# Patient Record
Sex: Male | Born: 1935 | Race: White | Hispanic: No | Smoking: Never smoker
Health system: Southern US, Community
[De-identification: ages and names within clinical notes are randomized; demographics above are authoritative.]

## PROBLEM LIST (undated history)

## (undated) DIAGNOSIS — Z950 Presence of cardiac pacemaker: Secondary | ICD-10-CM

## (undated) DIAGNOSIS — I10 Essential (primary) hypertension: Secondary | ICD-10-CM

## (undated) DIAGNOSIS — Z7901 Long term (current) use of anticoagulants: Secondary | ICD-10-CM

## (undated) DIAGNOSIS — N183 Chronic kidney disease, stage 3 unspecified: Secondary | ICD-10-CM

## (undated) DIAGNOSIS — K219 Gastro-esophageal reflux disease without esophagitis: Secondary | ICD-10-CM

## (undated) DIAGNOSIS — K5790 Diverticulosis of intestine, part unspecified, without perforation or abscess without bleeding: Secondary | ICD-10-CM

## (undated) DIAGNOSIS — Z972 Presence of dental prosthetic device (complete) (partial): Secondary | ICD-10-CM

## (undated) DIAGNOSIS — E119 Type 2 diabetes mellitus without complications: Secondary | ICD-10-CM

## (undated) DIAGNOSIS — G5793 Unspecified mononeuropathy of bilateral lower limbs: Secondary | ICD-10-CM

## (undated) DIAGNOSIS — K589 Irritable bowel syndrome without diarrhea: Secondary | ICD-10-CM

## (undated) DIAGNOSIS — N189 Chronic kidney disease, unspecified: Secondary | ICD-10-CM

## (undated) DIAGNOSIS — I2510 Atherosclerotic heart disease of native coronary artery without angina pectoris: Secondary | ICD-10-CM

## (undated) DIAGNOSIS — Z87442 Personal history of urinary calculi: Secondary | ICD-10-CM

## (undated) DIAGNOSIS — D696 Thrombocytopenia, unspecified: Secondary | ICD-10-CM

## (undated) DIAGNOSIS — H9190 Unspecified hearing loss, unspecified ear: Secondary | ICD-10-CM

## (undated) DIAGNOSIS — E538 Deficiency of other specified B group vitamins: Secondary | ICD-10-CM

## (undated) DIAGNOSIS — F419 Anxiety disorder, unspecified: Secondary | ICD-10-CM

## (undated) DIAGNOSIS — M109 Gout, unspecified: Secondary | ICD-10-CM

## (undated) DIAGNOSIS — I420 Dilated cardiomyopathy: Secondary | ICD-10-CM

## (undated) DIAGNOSIS — Z8679 Personal history of other diseases of the circulatory system: Secondary | ICD-10-CM

## (undated) DIAGNOSIS — G459 Transient cerebral ischemic attack, unspecified: Secondary | ICD-10-CM

## (undated) DIAGNOSIS — M1990 Unspecified osteoarthritis, unspecified site: Secondary | ICD-10-CM

## (undated) DIAGNOSIS — I499 Cardiac arrhythmia, unspecified: Secondary | ICD-10-CM

## (undated) DIAGNOSIS — Z8719 Personal history of other diseases of the digestive system: Secondary | ICD-10-CM

## (undated) HISTORY — PX: TONSILLECTOMY: SUR1361

## (undated) HISTORY — PX: COLONOSCOPY: SHX174

## (undated) HISTORY — PX: EYE SURGERY: SHX253

## (undated) HISTORY — PX: NECK SURGERY: SHX720

## (undated) HISTORY — PX: SHOULDER SURGERY: SHX246

## (undated) HISTORY — PX: INSERT / REPLACE / REMOVE PACEMAKER: SUR710

---

## 2004-05-22 ENCOUNTER — Emergency Department: Payer: Self-pay | Admitting: General Practice

## 2004-05-31 ENCOUNTER — Ambulatory Visit: Payer: Self-pay | Admitting: Urology

## 2004-06-07 ENCOUNTER — Ambulatory Visit: Payer: Self-pay | Admitting: Urology

## 2004-06-09 ENCOUNTER — Ambulatory Visit: Payer: Self-pay | Admitting: Urology

## 2005-04-13 ENCOUNTER — Ambulatory Visit: Payer: Self-pay | Admitting: Unknown Physician Specialty

## 2005-06-27 ENCOUNTER — Ambulatory Visit: Payer: Self-pay | Admitting: Unknown Physician Specialty

## 2005-11-20 ENCOUNTER — Ambulatory Visit: Payer: Self-pay | Admitting: Urology

## 2006-03-20 ENCOUNTER — Ambulatory Visit: Payer: Self-pay | Admitting: Unknown Physician Specialty

## 2006-11-12 HISTORY — PX: CORONARY ANGIOPLASTY: SHX604

## 2006-11-26 ENCOUNTER — Ambulatory Visit: Payer: Self-pay

## 2006-12-18 ENCOUNTER — Other Ambulatory Visit: Payer: Self-pay

## 2006-12-18 ENCOUNTER — Ambulatory Visit: Payer: Self-pay | Admitting: *Deleted

## 2007-07-31 ENCOUNTER — Ambulatory Visit (HOSPITAL_COMMUNITY): Admission: RE | Admit: 2007-07-31 | Discharge: 2007-07-31 | Payer: Self-pay | Admitting: Cardiology

## 2008-01-22 ENCOUNTER — Ambulatory Visit: Payer: Self-pay | Admitting: Unknown Physician Specialty

## 2008-04-01 ENCOUNTER — Ambulatory Visit: Payer: Self-pay | Admitting: Unknown Physician Specialty

## 2008-05-11 ENCOUNTER — Ambulatory Visit: Payer: Self-pay | Admitting: Unknown Physician Specialty

## 2008-12-24 ENCOUNTER — Ambulatory Visit: Payer: Self-pay | Admitting: Surgery

## 2008-12-28 ENCOUNTER — Ambulatory Visit: Payer: Self-pay | Admitting: Surgery

## 2010-07-01 ENCOUNTER — Other Ambulatory Visit: Payer: Self-pay | Admitting: Orthopedic Surgery

## 2010-07-01 DIAGNOSIS — M542 Cervicalgia: Secondary | ICD-10-CM

## 2010-07-04 ENCOUNTER — Ambulatory Visit
Admission: RE | Admit: 2010-07-04 | Discharge: 2010-07-04 | Disposition: A | Discharge status: Home / Self Care | Payer: Medicare Other | Source: Ambulatory Visit | Attending: Orthopedic Surgery | Admitting: Orthopedic Surgery

## 2010-07-04 DIAGNOSIS — M542 Cervicalgia: Secondary | ICD-10-CM

## 2010-07-26 NOTE — Op Note (Signed)
Juan Webster, Juan Webster                   ACCOUNT NO.:  [id number]   MEDICAL RECORD NO.:  [id number]          PATIENT TYPE:   LOCATION:                                 FACILITY:   PHYSICIAN:  Ritta Slot, MD     DATE OF BIRTH:  1935/07/27   DATE OF PROCEDURE:  07/31/2007  DATE OF DISCHARGE:                               OPERATIVE REPORT   PROCEDURE PERFORMED:  Dual-chamber pacemaker change due to elective  replacement indicator.   INDICATIONS AND PATIENT PROFILE:  Mr. Skelley is a very pleasant 75-year-  old male with history of CAD having received a drug-eluting stent to his  RCA on November 18, 2006, and also has a permanent pacemaker due to  history of underlying complete heart block, status post dual-chamber  pacer implant in August 2002.  He presented to [facility] on Jul 26, 2007, with symptoms of fatigue and lightheadedness, and he was found  to have his pacemaker generate ERI with VVI at 65 beats per minute.  Therefore, it was felt that his symptoms were due to coming to his  pacemaker being in ERI with VVI mode only.  There was no rate response  to him.  He was therefore brought to the [facility] for a change of his dual-chamber pacemaker,  so that he could have a new generator inserted.   PROCEDURE PERFORMED:  After informed consent, the patient's right  subclavicular region was prepped and draped in the usual sterile  fashion.  ECG monitoring was established.  A 2 mg of Versed and 75 mcg  of fentanyl were given intravenously for light sedation.  A 30 nL of  lidocaine was infiltrated over the prior pacemaker site and pocket for  local anesthesia.  Next, approximately 3-cm mid subclavicular incision  was then carried out, and hemostasis was obtained with electrocautery.  Blunt dissection was used to carry this down to the left pectoral  fascia.  Hemostasis was again obtained with electrocautery.  The old  generator and leads were  then identified and removed.  The old generator  was a Kappa 700DL, model number [id number], serial number [id number].  The A  lead with model number [id number], serial number [id number] was then  unscrewed from the Kappa generator.  It was then interrogated.  Once  thresholds and impedances were obtained, the V lead, model number  [id number], serial number [id number] was then removed from the generator and  hooked up to the analyzer.  Immediate capturing was obtained as he did  not have any underlying rhythm.  RA lead showed an R wave of 4.8 mV,  impedance was 558 ohms, threshold was 0.5 milliseconds, and current was  1.2 mA.  The RV lead interrogation revealed an impedance of 506 ohms,  threshold of 0.5 V at 0.5 milliseconds, and current was 1.1 mA.  No R  waves were sent.  The pocket was then irrigated with 1% kanamycin  solution.  Hemostasis obtained once again with cautery.  The A and the V  lead were then screwed to a  new adapted generator, model number ADDRL1,  serial number [id number], and pacing confirmed immediately.  The  pacemaker and leads were then delivered to the old pocket.  Hemostasis  once again obtained with electrocautery.  The generator was then sutured  down to the pectoralis fascia with silk suture after these were  confirmed to be tightened to the pacer pocket by screwing them in.  Subcutaneous layer was then closed using 2-0 Vicryl.  The thin skin was  then closed with 4-0 Vicryl.  Steri-Strips were applied.  The patient  returned to the recovery room in stable condition.   CONCLUSION:  Successful explant of a Kappa [id number], serial number  [id number], and successful implant of Medtronic adaptor LADDRL1, serial  number [id number].   The patient is sent home on 875 mg of Augmentin b.i.d. for 7 days.  He  will follow up in the [facility] next week.      Ritta Slot, MD  Electronically Signed     HS/MEDQ  D:  07/31/2007  T:  08/01/2007  Job:  [id number]

## 2010-10-25 ENCOUNTER — Ambulatory Visit: Payer: Self-pay | Admitting: Unknown Physician Specialty

## 2010-10-25 DIAGNOSIS — I10 Essential (primary) hypertension: Secondary | ICD-10-CM

## 2010-11-01 ENCOUNTER — Ambulatory Visit: Payer: Self-pay | Admitting: Unknown Physician Specialty

## 2012-08-27 ENCOUNTER — Ambulatory Visit: Payer: Self-pay | Admitting: Physical Medicine and Rehabilitation

## 2013-03-13 HISTORY — PX: NECK SURGERY: SHX720

## 2013-07-25 ENCOUNTER — Ambulatory Visit: Payer: Self-pay | Admitting: Internal Medicine

## 2015-06-24 ENCOUNTER — Encounter: Payer: Self-pay | Admitting: *Deleted

## 2015-06-29 NOTE — Discharge Instructions (Signed)

## 2015-06-30 ENCOUNTER — Ambulatory Visit
Admission: RE | Admit: 2015-06-30 | Discharge: 2015-06-30 | Disposition: A | Discharge status: Home / Self Care | Payer: Medicare HMO | Source: Ambulatory Visit | Attending: Ophthalmology | Admitting: Ophthalmology

## 2015-06-30 ENCOUNTER — Ambulatory Visit: Payer: Medicare HMO | Admitting: Anesthesiology

## 2015-06-30 ENCOUNTER — Encounter
Admission: RE | Disposition: A | Discharge status: Home / Self Care | Payer: Self-pay | Source: Ambulatory Visit | Attending: Ophthalmology

## 2015-06-30 DIAGNOSIS — Z9889 Other specified postprocedural states: Secondary | ICD-10-CM | POA: Diagnosis not present

## 2015-06-30 DIAGNOSIS — E1140 Type 2 diabetes mellitus with diabetic neuropathy, unspecified: Secondary | ICD-10-CM | POA: Diagnosis not present

## 2015-06-30 DIAGNOSIS — Z950 Presence of cardiac pacemaker: Secondary | ICD-10-CM | POA: Diagnosis not present

## 2015-06-30 DIAGNOSIS — K589 Irritable bowel syndrome without diarrhea: Secondary | ICD-10-CM | POA: Insufficient documentation

## 2015-06-30 DIAGNOSIS — E119 Type 2 diabetes mellitus without complications: Secondary | ICD-10-CM | POA: Diagnosis not present

## 2015-06-30 DIAGNOSIS — M109 Gout, unspecified: Secondary | ICD-10-CM | POA: Diagnosis not present

## 2015-06-30 DIAGNOSIS — H2512 Age-related nuclear cataract, left eye: Secondary | ICD-10-CM | POA: Diagnosis not present

## 2015-06-30 DIAGNOSIS — Z888 Allergy status to other drugs, medicaments and biological substances status: Secondary | ICD-10-CM | POA: Diagnosis not present

## 2015-06-30 DIAGNOSIS — H269 Unspecified cataract: Secondary | ICD-10-CM | POA: Diagnosis present

## 2015-06-30 DIAGNOSIS — M1990 Unspecified osteoarthritis, unspecified site: Secondary | ICD-10-CM | POA: Insufficient documentation

## 2015-06-30 HISTORY — DX: Personal history of other diseases of the digestive system: Z87.19

## 2015-06-30 HISTORY — PX: CATARACT EXTRACTION W/PHACO: SHX586

## 2015-06-30 HISTORY — DX: Unspecified osteoarthritis, unspecified site: M19.90

## 2015-06-30 HISTORY — DX: Essential (primary) hypertension: I10

## 2015-06-30 HISTORY — DX: Diverticulosis of intestine, part unspecified, without perforation or abscess without bleeding: K57.90

## 2015-06-30 HISTORY — DX: Gout, unspecified: M10.9

## 2015-06-30 HISTORY — DX: Irritable bowel syndrome without diarrhea: K58.9

## 2015-06-30 HISTORY — DX: Unspecified mononeuropathy of bilateral lower limbs: G57.93

## 2015-06-30 HISTORY — DX: Chronic kidney disease, unspecified: N18.9

## 2015-06-30 HISTORY — DX: Unspecified hearing loss, unspecified ear: H91.90

## 2015-06-30 HISTORY — DX: Presence of cardiac pacemaker: Z95.0

## 2015-06-30 HISTORY — DX: Cardiac arrhythmia, unspecified: I49.9

## 2015-06-30 HISTORY — DX: Type 2 diabetes mellitus without complications: E11.9

## 2015-06-30 HISTORY — DX: Presence of dental prosthetic device (complete) (partial): Z97.2

## 2015-06-30 LAB — GLUCOSE, CAPILLARY
GLUCOSE-CAPILLARY: 88 mg/dL (ref 65–99)
Glucose-Capillary: 94 mg/dL (ref 65–99)

## 2015-06-30 SURGERY — PHACOEMULSIFICATION, CATARACT, WITH IOL INSERTION
Anesthesia: Monitor Anesthesia Care | Laterality: Left | Wound class: Clean

## 2015-06-30 MED ORDER — MIDAZOLAM HCL 2 MG/2ML IJ SOLN
INTRAMUSCULAR | Status: DC | PRN
  Administered 2015-06-30: 2 mg via INTRAVENOUS

## 2015-06-30 MED ORDER — NA HYALUR & NA CHOND-NA HYALUR 0.4-0.35 ML IO KIT
PACK | INTRAOCULAR | Status: DC | PRN
  Administered 2015-06-30: 1 mL via INTRAOCULAR

## 2015-06-30 MED ORDER — CEFUROXIME OPHTHALMIC INJECTION 1 MG/0.1 ML
INJECTION | OPHTHALMIC | Status: DC | PRN
  Administered 2015-06-30: 0.1 mL via OPHTHALMIC

## 2015-06-30 MED ORDER — FENTANYL CITRATE (PF) 100 MCG/2ML IJ SOLN
INTRAMUSCULAR | Status: DC | PRN
  Administered 2015-06-30: 50 ug via INTRAVENOUS

## 2015-06-30 MED ORDER — TIMOLOL MALEATE 0.5 % OP SOLN
OPHTHALMIC | Status: DC | PRN
  Administered 2015-06-30: 1 [drp] via OPHTHALMIC

## 2015-06-30 MED ORDER — LIDOCAINE HCL (PF) 4 % IJ SOLN
INTRAOCULAR | Status: DC | PRN
  Administered 2015-06-30: 1 mL via OPHTHALMIC

## 2015-06-30 MED ORDER — BRIMONIDINE TARTRATE 0.2 % OP SOLN
OPHTHALMIC | Status: DC | PRN
  Administered 2015-06-30: 1 [drp] via OPHTHALMIC

## 2015-06-30 MED ORDER — TETRACAINE HCL 0.5 % OP SOLN
1.0000 [drp] | OPHTHALMIC | Status: DC | PRN
  Administered 2015-06-30: 1 [drp] via OPHTHALMIC

## 2015-06-30 MED ORDER — POVIDONE-IODINE 5 % OP SOLN
1.0000 "application " | OPHTHALMIC | Status: DC | PRN
  Administered 2015-06-30: 1 via OPHTHALMIC

## 2015-06-30 MED ORDER — EPINEPHRINE HCL 1 MG/ML IJ SOLN
INTRAMUSCULAR | Status: DC | PRN
  Administered 2015-06-30: 64 mL via OPHTHALMIC

## 2015-06-30 MED ORDER — ARMC OPHTHALMIC DILATING GEL
1.0000 "application " | OPHTHALMIC | Status: DC | PRN
  Administered 2015-06-30 (×2): 1 via OPHTHALMIC

## 2015-06-30 SURGICAL SUPPLY — 28 items
CANNULA ANT/CHMB 27GA (MISCELLANEOUS) ×3 IMPLANT
CARTRIDGE ABBOTT (MISCELLANEOUS) ×3 IMPLANT
GLOVE SURG LX 7.5 STRW (GLOVE) ×2
GLOVE SURG LX STRL 7.5 STRW (GLOVE) ×1 IMPLANT
GLOVE SURG TRIUMPH 8.0 PF LTX (GLOVE) ×3 IMPLANT
GOWN STRL REUS W/ TWL LRG LVL3 (GOWN DISPOSABLE) ×2 IMPLANT
GOWN STRL REUS W/TWL LRG LVL3 (GOWN DISPOSABLE) ×4
LENS IOL TECNIS TRC I 225 17.5 (Intraocular Lens) ×1 IMPLANT
LENS IOL TORIC 17.5 (Intraocular Lens) ×2 IMPLANT
LENS IOL TORIC 225 17.5 (Intraocular Lens) ×1 IMPLANT
MARKER SKIN DUAL TIP RULER LAB (MISCELLANEOUS) ×3 IMPLANT
NDL RETROBULBAR .5 NSTRL (NEEDLE) IMPLANT
NEEDLE FILTER BLUNT 18X 1/2SAF (NEEDLE) ×2
NEEDLE FILTER BLUNT 18X1 1/2 (NEEDLE) ×1 IMPLANT
PACK CATARACT BRASINGTON (MISCELLANEOUS) ×3 IMPLANT
PACK EYE AFTER SURG (MISCELLANEOUS) ×3 IMPLANT
PACK OPTHALMIC (MISCELLANEOUS) ×3 IMPLANT
RING MALYGIN 7.0 (MISCELLANEOUS) IMPLANT
SUT ETHILON 10-0 CS-B-6CS-B-6 (SUTURE)
SUT VICRYL  9 0 (SUTURE)
SUT VICRYL 9 0 (SUTURE) IMPLANT
SUTURE EHLN 10-0 CS-B-6CS-B-6 (SUTURE) IMPLANT
SYR 3ML LL SCALE MARK (SYRINGE) ×3 IMPLANT
SYR 5ML LL (SYRINGE) IMPLANT
SYR TB 1ML LUER SLIP (SYRINGE) ×3 IMPLANT
WATER STERILE IRR 250ML POUR (IV SOLUTION) ×3 IMPLANT
WATER STERILE IRR 500ML POUR (IV SOLUTION) IMPLANT
WIPE NON LINTING 3.25X3.25 (MISCELLANEOUS) ×3 IMPLANT

## 2015-06-30 NOTE — Anesthesia Preprocedure Evaluation (Addendum)
Anesthesia Evaluation  Patient identified by MRN, date of birth, ID band Patient awake    Reviewed: Allergy & Precautions, NPO status , Patient's Chart, lab work & pertinent test results, reviewed documented beta blocker date and time   Airway Mallampati: II  TM Distance: >3 FB Neck ROM: Full    Dental  (+) Partial Lower, Upper Dentures   Pulmonary neg pulmonary ROS,    Pulmonary exam normal        Cardiovascular hypertension, Pt. on medications Normal cardiovascular exam+ dysrhythmias + pacemaker      Neuro/Psych  Neuromuscular disease    GI/Hepatic Neg liver ROS, hiatal hernia, GERD  ,  Endo/Other  diabetes, Type 2, Oral Hypoglycemic Agents  Renal/GU Renal disease     Musculoskeletal  (+) Arthritis , Osteoarthritis,    Abdominal   Peds  Hematology negative hematology ROS (+)   Anesthesia Other Findings   Reproductive/Obstetrics                            Anesthesia Physical Anesthesia Plan  ASA: III  Anesthesia Plan: MAC   Post-op Pain Management:    Induction: Intravenous  Airway Management Planned:   Additional Equipment:   Intra-op Plan:   Post-operative Plan:   Informed Consent: I have reviewed the patients History and Physical, chart, labs and discussed the procedure including the risks, benefits and alternatives for the proposed anesthesia with the patient or authorized representative who has indicated his/her understanding and acceptance.     Plan Discussed with: CRNA  Anesthesia Plan Comments:         Anesthesia Quick Evaluation

## 2015-06-30 NOTE — Op Note (Signed)
LOCATION:  [facility]   PREOPERATIVE DIAGNOSIS:  Nuclear sclerotic cataract of the left eye.  H25.12  POSTOPERATIVE DIAGNOSIS:  Nuclear sclerotic cataract of the left eye.   PROCEDURE:  Phacoemulsification with Toric posterior chamber intraocular lens placement of the left eye.   LENS:  Implant Name Type Inv. Item Serial No. Manufacturer Lot No. LRB No. Used  toric 17.5D SE  lens  [id number]     [id number] [facility]   Left 1   Toric intraocular lens with 2.25 diopters of cylindrical power with axis orientation at 99 degrees.   ULTRASOUND TIME: 15 % of 1 minutes, 4 seconds.  CDE 9.5   SURGEON:  Deirdre Evenerhadwick R. Chrystal Zeimet, MD   ANESTHESIA:  Topical with tetracaine drops and 2% Xylocaine jelly, augmented with 1% preservative-free intracameral lidocaine.  COMPLICATIONS:  None.   DESCRIPTION OF PROCEDURE:  The patient was identified in the holding room and transported to the operating suite and placed in the supine position under the operating microscope.  The left eye was identified as the operative eye, and it was prepped and draped in the usual sterile ophthalmic fashion.    A clear-corneal paracentesis incision was made at the 1:30 position.  0.5 ml of preservative-free 1% lidocaine was injected into the anterior chamber. The anterior chamber was filled with Viscoat.  A 2.4 millimeter near clear corneal incision was then made at the 10:30 position.  A cystotome and capsulorrhexis forceps were then used to make a curvilinear capsulorrhexis.  Hydrodissection and hydrodelineation were then performed using balanced salt solution.   Phacoemulsification was then used in stop and chop fashion to remove the lens, nucleus and epinucleus.  The remaining cortex was aspirated using the irrigation and aspiration handpiece.  Provisc viscoelastic was then placed into the capsular bag to distend it for lens placement.  The Verion digital marker was used to align the implant at the intended axis.   A  17.5 diopter lens was then injected into the capsular bag.  It was rotated clockwise until the axis marks on the lens were approximately 15 degrees in the counterclockwise direction to the intended alignment.  The viscoelastic was aspirated from the eye using the irrigation aspiration handpiece.  Then, a Koch spatula through the sideport incision was used to rotate the lens in a clockwise direction until the axis markings of the intraocular lens were lined up with the Verion alignment.  Balanced salt solution was then used to hydrate the wounds. Cefuroxime 0.1 ml of a 10mg /ml solution was injected into the anterior chamber for a dose of 1 mg of intracameral antibiotic at the completion of the case.    The eye was noted to have a physiologic pressure and there was no wound leak noted.   Timolol and Brimonidine drops were applied to the eye.  The patient was taken to the recovery room in stable condition having had no complications of anesthesia or surgery.  Landry Kamath 06/30/2015, 8:35 AM

## 2015-06-30 NOTE — Anesthesia Procedure Notes (Signed)
Procedure Name: MAC Performed by: Deanglo Hissong Pre-anesthesia Checklist: Patient identified, Emergency Drugs available, Suction available, Timeout performed and Patient being monitored Patient Re-evaluated:Patient Re-evaluated prior to inductionOxygen Delivery Method: Nasal cannula Placement Confirmation: positive ETCO2       

## 2015-06-30 NOTE — Transfer of Care (Signed)
Immediate Anesthesia Transfer of Care Note  Patient: Juan Webster  Procedure(s) Performed: Procedure(s) with comments: CATARACT EXTRACTION PHACO AND INTRAOCULAR LENS PLACEMENT (IOC) (Left) - DIABETIC - oral meds  Patient Location: PACU  Anesthesia Type: MAC  Level of Consciousness: awake, alert  and patient cooperative  Airway and Oxygen Therapy: Patient Spontanous Breathing and Patient connected to supplemental oxygen  Post-op Assessment: Post-op Vital signs reviewed, Patient's Cardiovascular Status Stable, Respiratory Function Stable, Patent Airway and No signs of Nausea or vomiting  Post-op Vital Signs: Reviewed and stable  Complications: No apparent anesthesia complications

## 2015-06-30 NOTE — Anesthesia Postprocedure Evaluation (Signed)
Anesthesia Post Note  Patient: Juan Webster  Procedure(s) Performed: Procedure(s) (LRB): CATARACT EXTRACTION PHACO AND INTRAOCULAR LENS PLACEMENT (IOC) (Left)  Patient location during evaluation: PACU Anesthesia Type: MAC Level of consciousness: awake and alert and oriented Pain management: pain level controlled Vital Signs Assessment: post-procedure vital signs reviewed and stable Respiratory status: spontaneous breathing and nonlabored ventilation Cardiovascular status: stable Postop Assessment: no signs of nausea or vomiting and adequate PO intake Anesthetic complications: no    Harolyn RutherfordJoshua Steven Veazie

## 2015-06-30 NOTE — Addendum Note (Signed)
Addendum  created 06/30/15 0844 by Harolyn RutherfordJoshua Jamisyn Langer, DO   Modules edited: Anesthesia Review and Sign Navigator Section

## 2015-06-30 NOTE — H&P (Signed)
  The History and Physical notes are on paper, have been signed, and are to be scanned. The patient remains stable and unchanged from the H&P.   Previous H&P reviewed, patient examined, and there are no changes.  Chrystel Barefield 06/30/2015 7:43 AM

## 2015-07-01 ENCOUNTER — Encounter: Payer: Self-pay | Admitting: Ophthalmology

## 2015-07-22 NOTE — Discharge Instructions (Signed)

## 2015-07-23 ENCOUNTER — Encounter: Payer: Self-pay | Admitting: *Deleted

## 2015-07-28 ENCOUNTER — Ambulatory Visit
Admission: RE | Admit: 2015-07-28 | Discharge: 2015-07-28 | Disposition: A | Discharge status: Home / Self Care | Payer: Medicare HMO | Source: Ambulatory Visit | Attending: Ophthalmology | Admitting: Ophthalmology

## 2015-07-28 ENCOUNTER — Ambulatory Visit: Payer: Medicare HMO | Admitting: Anesthesiology

## 2015-07-28 ENCOUNTER — Encounter
Admission: RE | Disposition: A | Discharge status: Home / Self Care | Payer: Self-pay | Source: Ambulatory Visit | Attending: Ophthalmology

## 2015-07-28 DIAGNOSIS — G629 Polyneuropathy, unspecified: Secondary | ICD-10-CM | POA: Diagnosis not present

## 2015-07-28 DIAGNOSIS — I10 Essential (primary) hypertension: Secondary | ICD-10-CM | POA: Insufficient documentation

## 2015-07-28 DIAGNOSIS — E119 Type 2 diabetes mellitus without complications: Secondary | ICD-10-CM | POA: Insufficient documentation

## 2015-07-28 DIAGNOSIS — I499 Cardiac arrhythmia, unspecified: Secondary | ICD-10-CM | POA: Diagnosis not present

## 2015-07-28 DIAGNOSIS — H9190 Unspecified hearing loss, unspecified ear: Secondary | ICD-10-CM | POA: Insufficient documentation

## 2015-07-28 DIAGNOSIS — M109 Gout, unspecified: Secondary | ICD-10-CM | POA: Insufficient documentation

## 2015-07-28 DIAGNOSIS — Z950 Presence of cardiac pacemaker: Secondary | ICD-10-CM | POA: Diagnosis not present

## 2015-07-28 DIAGNOSIS — H2511 Age-related nuclear cataract, right eye: Secondary | ICD-10-CM | POA: Insufficient documentation

## 2015-07-28 DIAGNOSIS — K449 Diaphragmatic hernia without obstruction or gangrene: Secondary | ICD-10-CM | POA: Diagnosis not present

## 2015-07-28 DIAGNOSIS — Z888 Allergy status to other drugs, medicaments and biological substances status: Secondary | ICD-10-CM | POA: Insufficient documentation

## 2015-07-28 DIAGNOSIS — K5790 Diverticulosis of intestine, part unspecified, without perforation or abscess without bleeding: Secondary | ICD-10-CM | POA: Insufficient documentation

## 2015-07-28 DIAGNOSIS — M1990 Unspecified osteoarthritis, unspecified site: Secondary | ICD-10-CM | POA: Diagnosis not present

## 2015-07-28 DIAGNOSIS — K589 Irritable bowel syndrome without diarrhea: Secondary | ICD-10-CM | POA: Insufficient documentation

## 2015-07-28 DIAGNOSIS — Z955 Presence of coronary angioplasty implant and graft: Secondary | ICD-10-CM | POA: Insufficient documentation

## 2015-07-28 HISTORY — PX: CATARACT EXTRACTION W/PHACO: SHX586

## 2015-07-28 LAB — GLUCOSE, CAPILLARY
GLUCOSE-CAPILLARY: 150 mg/dL — AB (ref 65–99)
Glucose-Capillary: 123 mg/dL — ABNORMAL HIGH (ref 65–99)

## 2015-07-28 SURGERY — PHACOEMULSIFICATION, CATARACT, WITH IOL INSERTION
Anesthesia: Monitor Anesthesia Care | Laterality: Right | Wound class: Clean

## 2015-07-28 MED ORDER — NA HYALUR & NA CHOND-NA HYALUR 0.4-0.35 ML IO KIT
PACK | INTRAOCULAR | Status: DC | PRN
  Administered 2015-07-28: 1 mL via INTRAOCULAR

## 2015-07-28 MED ORDER — ONDANSETRON HCL 4 MG/2ML IJ SOLN
4.0000 mg | Freq: Once | INTRAMUSCULAR | Status: DC | PRN
Start: 2015-07-28 — End: 2015-07-28

## 2015-07-28 MED ORDER — BRIMONIDINE TARTRATE 0.2 % OP SOLN
OPHTHALMIC | Status: DC | PRN
  Administered 2015-07-28: 1 [drp] via OPHTHALMIC

## 2015-07-28 MED ORDER — CEFUROXIME OPHTHALMIC INJECTION 1 MG/0.1 ML
INJECTION | OPHTHALMIC | Status: DC | PRN
  Administered 2015-07-28: 0.1 mL via INTRACAMERAL

## 2015-07-28 MED ORDER — ARMC OPHTHALMIC DILATING GEL
1.0000 "application " | OPHTHALMIC | Status: DC | PRN
  Administered 2015-07-28 (×2): 1 via OPHTHALMIC

## 2015-07-28 MED ORDER — TETRACAINE HCL 0.5 % OP SOLN
1.0000 [drp] | OPHTHALMIC | Status: DC | PRN
  Administered 2015-07-28: 1 [drp] via OPHTHALMIC

## 2015-07-28 MED ORDER — ACETAMINOPHEN 325 MG PO TABS: 325.0000 mg | ORAL_TABLET | ORAL | Status: DC | PRN

## 2015-07-28 MED ORDER — EPINEPHRINE HCL 1 MG/ML IJ SOLN
INTRAOCULAR | Status: DC | PRN
  Administered 2015-07-28: 59 mL via OPHTHALMIC
  Administered 2015-07-28: 08:00:00 via OPHTHALMIC

## 2015-07-28 MED ORDER — LIDOCAINE HCL (PF) 4 % IJ SOLN
INTRAOCULAR | Status: DC | PRN
  Administered 2015-07-28: 1 mL via OPHTHALMIC

## 2015-07-28 MED ORDER — ACETAMINOPHEN 160 MG/5ML PO SOLN: 325.0000 mg | ORAL | Status: DC | PRN

## 2015-07-28 MED ORDER — TIMOLOL MALEATE 0.5 % OP SOLN
OPHTHALMIC | Status: DC | PRN
  Administered 2015-07-28: 1 [drp] via OPHTHALMIC

## 2015-07-28 MED ORDER — FENTANYL CITRATE (PF) 100 MCG/2ML IJ SOLN
INTRAMUSCULAR | Status: DC | PRN
  Administered 2015-07-28: 50 ug via INTRAVENOUS

## 2015-07-28 MED ORDER — POVIDONE-IODINE 5 % OP SOLN
1.0000 "application " | OPHTHALMIC | Status: DC | PRN
  Administered 2015-07-28: 1 via OPHTHALMIC

## 2015-07-28 MED ORDER — MIDAZOLAM HCL 2 MG/2ML IJ SOLN
INTRAMUSCULAR | Status: DC | PRN
  Administered 2015-07-28: 2 mg via INTRAVENOUS

## 2015-07-28 SURGICAL SUPPLY — 23 items
CANNULA ANT/CHMB 27GA (MISCELLANEOUS) ×3 IMPLANT
CARTRIDGE ABBOTT (MISCELLANEOUS) ×3 IMPLANT
GLOVE SURG LX 7.5 STRW (GLOVE) ×2
GLOVE SURG LX STRL 7.5 STRW (GLOVE) ×1 IMPLANT
GLOVE SURG TRIUMPH 8.0 PF LTX (GLOVE) ×3 IMPLANT
GOWN STRL REUS W/ TWL LRG LVL3 (GOWN DISPOSABLE) ×2 IMPLANT
GOWN STRL REUS W/TWL LRG LVL3 (GOWN DISPOSABLE) ×4
LENS IOL TECNIS TRC I 150 18.5 (Intraocular Lens) ×1 IMPLANT
LENS IOL TORIC 150 18.5 (Intraocular Lens) ×1 IMPLANT
LENS IOL TORIC 18.5 (Intraocular Lens) ×2 IMPLANT
MARKER SKIN DUAL TIP RULER LAB (MISCELLANEOUS) ×3 IMPLANT
NDL RETROBULBAR .5 NSTRL (NEEDLE) IMPLANT
PACK CATARACT BRASINGTON (MISCELLANEOUS) ×3 IMPLANT
PACK EYE AFTER SURG (MISCELLANEOUS) ×3 IMPLANT
PACK OPTHALMIC (MISCELLANEOUS) ×3 IMPLANT
RING MALYGIN 7.0 (MISCELLANEOUS) IMPLANT
SUT ETHILON 10-0 CS-B-6CS-B-6 (SUTURE)
SUT VICRYL  9 0 (SUTURE)
SUT VICRYL 9 0 (SUTURE) IMPLANT
SUTURE EHLN 10-0 CS-B-6CS-B-6 (SUTURE) IMPLANT
SYR TB 1ML LUER SLIP (SYRINGE) ×3 IMPLANT
WATER STERILE IRR 250ML POUR (IV SOLUTION) ×3 IMPLANT
WIPE NON LINTING 3.25X3.25 (MISCELLANEOUS) ×3 IMPLANT

## 2015-07-28 NOTE — Anesthesia Procedure Notes (Signed)
Procedure Name: MAC Performed by: Penda Venturi Pre-anesthesia Checklist: Patient identified, Emergency Drugs available, Suction available, Timeout performed and Patient being monitored Patient Re-evaluated:Patient Re-evaluated prior to inductionOxygen Delivery Method: Nasal cannula Placement Confirmation: positive ETCO2       

## 2015-07-28 NOTE — Anesthesia Postprocedure Evaluation (Signed)
Anesthesia Post Note  Patient: Juan Webster  Procedure(s) Performed: Procedure(s) (LRB): CATARACT EXTRACTION PHACO AND INTRAOCULAR LENS PLACEMENT (IOC) right eye (Right)  Patient location during evaluation: PACU Anesthesia Type: MAC Level of consciousness: awake and alert Pain management: pain level controlled Vital Signs Assessment: post-procedure vital signs reviewed and stable Respiratory status: spontaneous breathing, nonlabored ventilation, respiratory function stable and patient connected to nasal cannula oxygen Cardiovascular status: stable and blood pressure returned to baseline Anesthetic complications: no    Durene Fruitshomas,  Amaar Oshita G

## 2015-07-28 NOTE — Transfer of Care (Signed)
Immediate Anesthesia Transfer of Care Note  Patient: Juan Webster  Procedure(s) Performed: Procedure(s) with comments: CATARACT EXTRACTION PHACO AND INTRAOCULAR LENS PLACEMENT (IOC) right eye (Right) - DIABETIC - oral meds TORIC  Patient Location: PACU  Anesthesia Type: MAC  Level of Consciousness: awake, alert  and patient cooperative  Airway and Oxygen Therapy: Patient Spontanous Breathing and Patient connected to supplemental oxygen  Post-op Assessment: Post-op Vital signs reviewed, Patient's Cardiovascular Status Stable, Respiratory Function Stable, Patent Airway and No signs of Nausea or vomiting  Post-op Vital Signs: Reviewed and stable  Complications: No apparent anesthesia complications

## 2015-07-28 NOTE — Anesthesia Preprocedure Evaluation (Signed)
Anesthesia Evaluation  Patient identified by MRN, date of birth, ID band Patient awake    Reviewed: Allergy & Precautions, NPO status , Patient's Chart, lab work & pertinent test results, reviewed documented beta blocker date and time   Airway Mallampati: II  TM Distance: >3 FB Neck ROM: Full    Dental  (+) Partial Lower, Upper Dentures   Pulmonary neg pulmonary ROS,    Pulmonary exam normal        Cardiovascular hypertension, Pt. on medications Normal cardiovascular exam+ dysrhythmias + pacemaker      Neuro/Psych  Neuromuscular disease    GI/Hepatic Neg liver ROS, hiatal hernia, GERD  ,  Endo/Other  diabetes, Type 2, Oral Hypoglycemic Agents  Renal/GU Renal disease     Musculoskeletal  (+) Arthritis , Osteoarthritis,    Abdominal   Peds  Hematology negative hematology ROS (+)   Anesthesia Other Findings   Reproductive/Obstetrics                             Anesthesia Physical  Anesthesia Plan  ASA: III  Anesthesia Plan: MAC   Post-op Pain Management:    Induction: Intravenous  Airway Management Planned:   Additional Equipment:   Intra-op Plan:   Post-operative Plan:   Informed Consent: I have reviewed the patients History and Physical, chart, labs and discussed the procedure including the risks, benefits and alternatives for the proposed anesthesia with the patient or authorized representative who has indicated his/her understanding and acceptance.     Plan Discussed with: CRNA  Anesthesia Plan Comments:         Anesthesia Quick Evaluation

## 2015-07-28 NOTE — Op Note (Signed)
LOCATION:  [facility]   PREOPERATIVE DIAGNOSIS:  Nuclear sclerotic cataract of the right eye.  H25.11   POSTOPERATIVE DIAGNOSIS:  Nuclear sclerotic cataract of the right eye.   PROCEDURE:  Phacoemulsification with Toric posterior chamber intraocular lens placement of the right eye.   LENS:   Implant Name Type Inv. Item Serial No. Manufacturer Lot No. LRB No. Used  technis toric iol aspheric     [id number] [facility]   Right 1     [id number] Toric intraocular lens with 1.5 diopters of cylindrical power with axis orientation at 99 degrees.   ULTRASOUND TIME: 16 % of 1 minutes, 17 seconds.  CDE 12.3   SURGEON:  Deirdre Evenerhadwick R. Nakeita Styles, MD   ANESTHESIA: Topical with tetracaine drops and 2% Xylocaine jelly, augmented with 1% preservative-free intracameral lidocaine. .   COMPLICATIONS:  None.   DESCRIPTION OF PROCEDURE:  The patient was identified in the holding room and transported to the operating suite and placed in the supine position under the operating microscope.  The right eye was identified as the operative eye, and it was prepped and draped in the usual sterile ophthalmic fashion.    A clear-corneal paracentesis incision was made at the 12:00 position.  0.5 ml of preservative-free 1% lidocaine was injected into the anterior chamber. The anterior chamber was filled with Viscoat.  A 2.4 millimeter near clear corneal incision was then made at the 9:00 position.  A cystotome and capsulorrhexis forceps were then used to make a curvilinear capsulorrhexis.  Hydrodissection and hydrodelineation were then performed using balanced salt solution.   Phacoemulsification was then used in stop and chop fashion to remove the lens, nucleus and epinucleus.  The remaining cortex was aspirated using the irrigation and aspiration handpiece.  Provisc viscoelastic was then placed into the capsular bag to distend it for lens placement.  The Verion digital marker was used to align the implant at  the intended axis.   A Toric lens was then injected into the capsular bag.  It was rotated clockwise until the axis marks on the lens were approximately 15 degrees in the counterclockwise direction to the intended alignment.  The viscoelastic was aspirated from the eye using the irrigation aspiration handpiece.  Then, a Koch spatula through the sideport incision was used to rotate the lens in a clockwise direction until the axis markings of the intraocular lens were lined up with the Verion alignment.  Balanced salt solution was then used to hydrate the wounds. Cefuroxime 0.1 ml of a 10mg /ml solution was injected into the anterior chamber for a dose of 1 mg of intracameral antibiotic at the completion of the case.    The eye was noted to have a physiologic pressure and there was no wound leak noted.   Timolol and Brimonidine drops were applied to the eye.  The patient was taken to the recovery room in stable condition having had no complications of anesthesia or surgery.  Rosslyn Pasion 07/28/2015, 8:33 AM

## 2015-07-28 NOTE — H&P (Signed)
  The History and Physical notes are on paper, have been signed, and are to be scanned. The patient remains stable and unchanged from the H&P.   Previous H&P reviewed, patient examined, and there are no changes.  Juan Webster 07/28/2015 7:42 AM

## 2015-07-29 ENCOUNTER — Encounter: Payer: Self-pay | Admitting: Ophthalmology

## 2016-07-02 ENCOUNTER — Emergency Department: Payer: Medicare HMO

## 2016-07-02 ENCOUNTER — Encounter: Payer: Self-pay | Admitting: *Deleted

## 2016-07-02 ENCOUNTER — Inpatient Hospital Stay
Admission: EM | Admit: 2016-07-02 | Discharge: 2016-07-04 | DRG: 281 | Disposition: A | Discharge status: Home / Self Care | Payer: Medicare HMO | Attending: Internal Medicine | Admitting: Internal Medicine

## 2016-07-02 DIAGNOSIS — Z9842 Cataract extraction status, left eye: Secondary | ICD-10-CM | POA: Diagnosis not present

## 2016-07-02 DIAGNOSIS — I214 Non-ST elevation (NSTEMI) myocardial infarction: Secondary | ICD-10-CM | POA: Diagnosis present

## 2016-07-02 DIAGNOSIS — N183 Chronic kidney disease, stage 3 (moderate): Secondary | ICD-10-CM | POA: Diagnosis present

## 2016-07-02 DIAGNOSIS — E1151 Type 2 diabetes mellitus with diabetic peripheral angiopathy without gangrene: Secondary | ICD-10-CM | POA: Diagnosis present

## 2016-07-02 DIAGNOSIS — K589 Irritable bowel syndrome without diarrhea: Secondary | ICD-10-CM | POA: Diagnosis present

## 2016-07-02 DIAGNOSIS — Z950 Presence of cardiac pacemaker: Secondary | ICD-10-CM

## 2016-07-02 DIAGNOSIS — H9190 Unspecified hearing loss, unspecified ear: Secondary | ICD-10-CM | POA: Diagnosis present

## 2016-07-02 DIAGNOSIS — I429 Cardiomyopathy, unspecified: Secondary | ICD-10-CM | POA: Diagnosis present

## 2016-07-02 DIAGNOSIS — Z888 Allergy status to other drugs, medicaments and biological substances status: Secondary | ICD-10-CM | POA: Diagnosis not present

## 2016-07-02 DIAGNOSIS — I2510 Atherosclerotic heart disease of native coronary artery without angina pectoris: Secondary | ICD-10-CM | POA: Diagnosis present

## 2016-07-02 DIAGNOSIS — M109 Gout, unspecified: Secondary | ICD-10-CM | POA: Diagnosis present

## 2016-07-02 DIAGNOSIS — Z9841 Cataract extraction status, right eye: Secondary | ICD-10-CM | POA: Diagnosis not present

## 2016-07-02 DIAGNOSIS — Z7984 Long term (current) use of oral hypoglycemic drugs: Secondary | ICD-10-CM

## 2016-07-02 DIAGNOSIS — I252 Old myocardial infarction: Secondary | ICD-10-CM | POA: Diagnosis not present

## 2016-07-02 DIAGNOSIS — Z7982 Long term (current) use of aspirin: Secondary | ICD-10-CM | POA: Diagnosis not present

## 2016-07-02 DIAGNOSIS — Z955 Presence of coronary angioplasty implant and graft: Secondary | ICD-10-CM | POA: Diagnosis not present

## 2016-07-02 DIAGNOSIS — Z961 Presence of intraocular lens: Secondary | ICD-10-CM | POA: Diagnosis present

## 2016-07-02 DIAGNOSIS — I129 Hypertensive chronic kidney disease with stage 1 through stage 4 chronic kidney disease, or unspecified chronic kidney disease: Secondary | ICD-10-CM | POA: Diagnosis present

## 2016-07-02 DIAGNOSIS — E1122 Type 2 diabetes mellitus with diabetic chronic kidney disease: Secondary | ICD-10-CM | POA: Diagnosis present

## 2016-07-02 HISTORY — DX: Non-ST elevation (NSTEMI) myocardial infarction: I21.4

## 2016-07-02 LAB — BASIC METABOLIC PANEL
Anion gap: 9 (ref 5–15)
BUN: 36 mg/dL — AB (ref 6–20)
CHLORIDE: 104 mmol/L (ref 101–111)
CO2: 23 mmol/L (ref 22–32)
CREATININE: 1.49 mg/dL — AB (ref 0.61–1.24)
Calcium: 8.8 mg/dL — ABNORMAL LOW (ref 8.9–10.3)
GFR calc non Af Amer: 43 mL/min — ABNORMAL LOW (ref >60)
GFR, EST AFRICAN AMERICAN: 50 mL/min — AB (ref >60)
Glucose, Bld: 246 mg/dL — ABNORMAL HIGH (ref 65–99)
Potassium: 4.5 mmol/L (ref 3.5–5.1)
SODIUM: 136 mmol/L (ref 135–145)

## 2016-07-02 LAB — APTT: aPTT: 28 seconds (ref 24–36)

## 2016-07-02 LAB — PROTIME-INR
INR: 1.11
Prothrombin Time: 14.3 seconds (ref 11.4–15.2)

## 2016-07-02 LAB — HEPATIC FUNCTION PANEL
ALT: 14 U/L — ABNORMAL LOW (ref 17–63)
AST: 31 U/L (ref 15–41)
Albumin: 3.8 g/dL (ref 3.5–5.0)
Alkaline Phosphatase: 57 U/L (ref 38–126)
Bilirubin, Direct: 0.1 mg/dL (ref 0.1–0.5)
Indirect Bilirubin: 0.7 mg/dL (ref 0.3–0.9)
Total Bilirubin: 0.8 mg/dL (ref 0.3–1.2)
Total Protein: 6.6 g/dL (ref 6.5–8.1)

## 2016-07-02 LAB — CBC
HCT: 39.3 % — ABNORMAL LOW (ref 40.0–52.0)
Hemoglobin: 13.6 g/dL (ref 13.0–18.0)
MCH: 29.9 pg (ref 26.0–34.0)
MCHC: 34.6 g/dL (ref 32.0–36.0)
MCV: 86.6 fL (ref 80.0–100.0)
PLATELETS: 136 10*3/uL — AB (ref 150–440)
RBC: 4.54 MIL/uL (ref 4.40–5.90)
RDW: 13.5 % (ref 11.5–14.5)
WBC: 6.9 10*3/uL (ref 3.8–10.6)

## 2016-07-02 LAB — TROPONIN I
TROPONIN I: 0.16 ng/mL — AB (ref <0.03)
TROPONIN I: 14.55 ng/mL — AB (ref <0.03)
Troponin I: 8.82 ng/mL (ref <0.03)

## 2016-07-02 LAB — BRAIN NATRIURETIC PEPTIDE: B Natriuretic Peptide: 129 pg/mL — ABNORMAL HIGH (ref 0.0–100.0)

## 2016-07-02 LAB — HEPARIN LEVEL (UNFRACTIONATED): HEPARIN UNFRACTIONATED: 0.39 [IU]/mL (ref 0.30–0.70)

## 2016-07-02 LAB — GLUCOSE, CAPILLARY
GLUCOSE-CAPILLARY: 84 mg/dL (ref 65–99)
Glucose-Capillary: 115 mg/dL — ABNORMAL HIGH (ref 65–99)

## 2016-07-02 LAB — TSH: TSH: 0.635 u[IU]/mL (ref 0.350–4.500)

## 2016-07-02 MED ORDER — SODIUM CHLORIDE 0.9 % IV SOLN: 250.0000 mL | INTRAVENOUS | Status: DC | PRN

## 2016-07-02 MED ORDER — CELECOXIB 200 MG PO CAPS
200.0000 mg | ORAL_CAPSULE | Freq: Two times a day (BID) | ORAL | Status: DC
  Administered 2016-07-02 – 2016-07-04 (×4): 200 mg via ORAL
  Filled 2016-07-02 (×5): qty 1

## 2016-07-02 MED ORDER — ONDANSETRON HCL 4 MG/2ML IJ SOLN: 4.0000 mg | Freq: Four times a day (QID) | INTRAMUSCULAR | Status: DC | PRN

## 2016-07-02 MED ORDER — CELECOXIB 200 MG PO CAPS: 200.0000 mg | ORAL_CAPSULE | Freq: Every day | ORAL | Status: DC

## 2016-07-02 MED ORDER — FOLIC ACID 1 MG PO TABS
1.0000 mg | ORAL_TABLET | Freq: Every day | ORAL | Status: DC
  Administered 2016-07-03 – 2016-07-04 (×2): 1 mg via ORAL
  Filled 2016-07-02 (×2): qty 1

## 2016-07-02 MED ORDER — CYCLOBENZAPRINE HCL 10 MG PO TABS: 10.0000 mg | ORAL_TABLET | Freq: Three times a day (TID) | ORAL | Status: DC | PRN

## 2016-07-02 MED ORDER — SODIUM CHLORIDE 0.9 % WEIGHT BASED INFUSION
3.0000 mL/kg/h | INTRAVENOUS | Status: AC
  Administered 2016-07-03: 3 mL/kg/h via INTRAVENOUS

## 2016-07-02 MED ORDER — HYDROCHLOROTHIAZIDE 25 MG PO TABS
25.0000 mg | ORAL_TABLET | Freq: Every day | ORAL | Status: DC
  Administered 2016-07-03: 25 mg via ORAL
  Filled 2016-07-02: qty 1

## 2016-07-02 MED ORDER — ONDANSETRON HCL 4 MG PO TABS
4.0000 mg | ORAL_TABLET | Freq: Four times a day (QID) | ORAL | Status: DC | PRN
Start: 2016-07-02 — End: 2016-07-04

## 2016-07-02 MED ORDER — ALLOPURINOL 100 MG PO TABS
100.0000 mg | ORAL_TABLET | Freq: Every day | ORAL | Status: DC
  Administered 2016-07-03 – 2016-07-04 (×2): 100 mg via ORAL
  Filled 2016-07-02 (×2): qty 1

## 2016-07-02 MED ORDER — HEPARIN (PORCINE) IN NACL 100-0.45 UNIT/ML-% IJ SOLN
1100.0000 [IU]/h | INTRAMUSCULAR | Status: DC
  Administered 2016-07-02: 1100 [IU]/h via INTRAVENOUS
  Filled 2016-07-02 (×2): qty 250

## 2016-07-02 MED ORDER — LISINOPRIL 20 MG PO TABS
20.0000 mg | ORAL_TABLET | Freq: Every day | ORAL | Status: DC
  Administered 2016-07-03: 20 mg via ORAL
  Filled 2016-07-02: qty 1

## 2016-07-02 MED ORDER — OMEGA-3-ACID ETHYL ESTERS 1 G PO CAPS: 1.0000 | ORAL_CAPSULE | Freq: Two times a day (BID) | ORAL | Status: DC

## 2016-07-02 MED ORDER — METOPROLOL TARTRATE 25 MG PO TABS
12.5000 mg | ORAL_TABLET | Freq: Two times a day (BID) | ORAL | Status: DC
  Administered 2016-07-02 – 2016-07-03 (×2): 12.5 mg via ORAL
  Filled 2016-07-02 (×2): qty 1

## 2016-07-02 MED ORDER — SODIUM CHLORIDE 0.9 % IV SOLN
INTRAVENOUS | Status: DC
  Administered 2016-07-02 – 2016-07-03 (×2): via INTRAVENOUS

## 2016-07-02 MED ORDER — ASPIRIN 81 MG PO CHEW
81.0000 mg | CHEWABLE_TABLET | ORAL | Status: AC
  Administered 2016-07-03: 81 mg via ORAL
  Filled 2016-07-02: qty 1

## 2016-07-02 MED ORDER — TRAMADOL HCL 50 MG PO TABS: 50.0000 mg | ORAL_TABLET | Freq: Four times a day (QID) | ORAL | Status: DC | PRN

## 2016-07-02 MED ORDER — SODIUM CHLORIDE 0.9% FLUSH
3.0000 mL | Freq: Two times a day (BID) | INTRAVENOUS | Status: DC
  Administered 2016-07-02: 3 mL via INTRAVENOUS

## 2016-07-02 MED ORDER — SODIUM CHLORIDE 0.9% FLUSH
3.0000 mL | Freq: Two times a day (BID) | INTRAVENOUS | Status: DC
  Administered 2016-07-03: 3 mL via INTRAVENOUS

## 2016-07-02 MED ORDER — LINAGLIPTIN 5 MG PO TABS
5.0000 mg | ORAL_TABLET | Freq: Every day | ORAL | Status: DC
  Administered 2016-07-04: 5 mg via ORAL
  Filled 2016-07-02 (×2): qty 1

## 2016-07-02 MED ORDER — FENOFIBRATE 160 MG PO TABS
160.0000 mg | ORAL_TABLET | Freq: Every evening | ORAL | Status: DC
  Administered 2016-07-02 – 2016-07-03 (×2): 160 mg via ORAL
  Filled 2016-07-02 (×2): qty 1

## 2016-07-02 MED ORDER — INSULIN ASPART 100 UNIT/ML ~~LOC~~ SOLN
0.0000 [IU] | Freq: Three times a day (TID) | SUBCUTANEOUS | Status: DC
  Administered 2016-07-03: 2 [IU] via SUBCUTANEOUS
  Administered 2016-07-03: 1 [IU] via SUBCUTANEOUS
  Administered 2016-07-04: 2 [IU] via SUBCUTANEOUS
  Filled 2016-07-02 (×2): qty 2
  Filled 2016-07-02: qty 1

## 2016-07-02 MED ORDER — HEPARIN BOLUS VIA INFUSION
4000.0000 [IU] | Freq: Once | INTRAVENOUS | Status: AC
  Administered 2016-07-02: 4000 [IU] via INTRAVENOUS
  Filled 2016-07-02: qty 4000

## 2016-07-02 MED ORDER — GLIMEPIRIDE 4 MG PO TABS: 4.0000 mg | ORAL_TABLET | Freq: Two times a day (BID) | ORAL | Status: DC

## 2016-07-02 MED ORDER — OMEGA-3-ACID ETHYL ESTERS 1 G PO CAPS
1.0000 | ORAL_CAPSULE | Freq: Two times a day (BID) | ORAL | Status: DC
  Administered 2016-07-02 – 2016-07-04 (×4): 1 g via ORAL
  Filled 2016-07-02 (×4): qty 1

## 2016-07-02 MED ORDER — SODIUM CHLORIDE 0.9% FLUSH: 3.0000 mL | INTRAVENOUS | Status: DC | PRN

## 2016-07-02 MED ORDER — SODIUM CHLORIDE 0.9 % WEIGHT BASED INFUSION: 1.0000 mL/kg/h | INTRAVENOUS | Status: DC

## 2016-07-02 MED ORDER — ASPIRIN EC 81 MG PO TBEC
81.0000 mg | DELAYED_RELEASE_TABLET | Freq: Every day | ORAL | Status: DC
  Administered 2016-07-04: 81 mg via ORAL
  Filled 2016-07-02 (×2): qty 1

## 2016-07-02 MED ORDER — ACETAMINOPHEN 500 MG PO TABS
500.0000 mg | ORAL_TABLET | Freq: Four times a day (QID) | ORAL | Status: DC | PRN
Start: 2016-07-02 — End: 2016-07-04
  Administered 2016-07-02: 500 mg via ORAL
  Filled 2016-07-02: qty 1

## 2016-07-02 NOTE — ED Triage Notes (Addendum)
Per EMS report, patient c/o substernal/epigastric pain that radiates to the left arm. Patient c/o shortness of breath at the time and first responders state patient was diaphoretic which has since resolved. Patient is alert and oriented, states pain is sharp, heavy and has decreased at this time. Patient had  ASA and two sprays of nitro PTA. Patient denies immediate relief of chest pain with the Nitro spray.

## 2016-07-02 NOTE — Progress Notes (Signed)
Dr. Lady Gary paged to make aware of 8.82 troponin- pt asymptomatic/ heparin gtt/ heart cath planned for tomorrow/ continue to assess

## 2016-07-02 NOTE — Progress Notes (Addendum)
ANTICOAGULATION CONSULT NOTE - Initial Consult  Pharmacy Consult for heparin drip  Indication: chest pain/ACS  Allergies  Allergen Reactions  . Simvastatin     Muscle weakness    Patient Measurements: Height:  (185.4 cm) Weight: 199 lb 3.2 oz (90.4 kg) IBW/kg (Calculated) : 79.9  Vital Signs: Temp: 98.2 F (36.8 C) (04/22 2020) Temp Source: Oral (04/22 2020) BP: 153/58 (04/22 2210) Pulse Rate: 69 (04/22 2211)  Labs:  Recent Labs  07/02/16 1134 07/02/16 1339 07/02/16 1559 07/02/16 2144 07/02/16 2257  HGB 13.6  --   --   --   --   HCT 39.3*  --   --   --   --   PLT 136*  --   --   --   --   APTT  --  28  --   --   --   LABPROT  --  14.3  --   --   --   INR  --  1.11  --   --   --   HEPARINUNFRC  --   --   --   --  0.39  CREATININE 1.49*  --   --   --   --   TROPONINI 0.16*  --  8.82* 14.55*  --     Estimated Creatinine Clearance: 45.4 mL/min (A) (by C-G formula based on SCr of 1.49 mg/dL (H)).   Medical History: Past Medical History:  Diagnosis Date  . Arthritis   . Chronic kidney disease    CKD -stage 3  . Diabetes mellitus without complication (HCC)   . Diverticulosis   . Dysrhythmia    controlled with pacemaker  . Gout   . Hard of hearing   . History of hiatal hernia   . Hypertension   . IBS (irritable bowel syndrome)   . 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 Presence of permanent cardiac pacemaker 10/17/00, 07/31/07   Medtronic Adapta DR  [email or web address]  . Wears dentures    full upper, partial lower   Assessment: 81 yo male with chest pain and elevated troponin. Pharmacy consulted for heparin dosing and monitoring for ACS.   Goal of Therapy:  Heparin level 0.3-0.7 units/ml Monitor platelets by anticoagulation protocol: Yes   Plan:  Baseline labs ordered.  Give 4000 units bolus x 1 Start heparin infusion at 1100 units/hr Check anti-Xa level in 8 hours and daily while on heparin Continue to monitor H&H and platelets   4/22 23:00  heparin level 0.39. Check confirmatory level with tomorrow AM labs.  4/23 AM heparin level 0.45. Continue current regimen. Recheck heparin level and CBC with tomorrow AM labs.  Erich Montane, PharmD, BCPS Clinical Pharmacist 07/02/2016 11:27 PM

## 2016-07-02 NOTE — Progress Notes (Signed)
ANTICOAGULATION CONSULT NOTE - Initial Consult  Pharmacy Consult for heparin drip  Indication: chest pain/ACS  Allergies  Allergen Reactions  . Simvastatin     Muscle weakness    Patient Measurements: Height:  (185.4 cm) Weight: 206 lb (93.4 kg) IBW/kg (Calculated) : 79.9  Vital Signs: Temp: 98.2 F (36.8 C) (04/22 1126) Temp Source: Oral (04/22 1126) BP: 157/68 (04/22 1126) Pulse Rate: 59 (04/22 1126)  Labs:  Recent Labs  07/02/16 1134  HGB 13.6  HCT 39.3*  PLT 136*  CREATININE 1.49*  TROPONINI 0.16*    Estimated Creatinine Clearance: 45.4 mL/min (A) (by C-G formula based on SCr of 1.49 mg/dL (H)).   Medical History: Past Medical History:  Diagnosis Date  . Arthritis   . Chronic kidney disease    CKD -stage 3  . Diabetes mellitus without complication (HCC)   . Diverticulosis   . Dysrhythmia    controlled with pacemaker  . Gout   . Hard of hearing   . History of hiatal hernia   . Hypertension   . IBS (irritable bowel syndrome)   . 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 Presence of permanent cardiac pacemaker 10/17/00, 07/31/07   [employer]tronic Adapta DR  ADDRL1  . Wears dentures    full upper, partial lower   Assessment: 81 yo male with chest pain and elevated troponin. Pharmacy consulted for heparin dosing and monitoring for ACS.   Goal of Therapy:  Heparin level 0.3-0.7 units/ml Monitor platelets by anticoagulation protocol: Yes   Plan:  Baseline labs ordered.  Give 4000 units bolus x 1 Start heparin infusion at 1100 units/hr Check anti-Xa level in 8 hours and daily while on heparin Continue to monitor H&H and platelets  Gardner Candle, PharmD, BCPS Clinical Pharmacist 07/02/2016 12:54 PM

## 2016-07-02 NOTE — ED Provider Notes (Signed)
[facility] Emergency Department Provider Note  ____________________________________________   First MD Initiated Contact with Patient 07/02/16 1125     (approximate)  I have reviewed the triage vital signs and the nursing notes.   HISTORY  Chief Complaint Chest Pain    HPI Juan Webster is a 81 y.o. male who comes to the emergency department via EMS with moderate severity substernal pressure-like chest pain radiating to his left arm. He took 324 mg of baby aspirin after he called 911. EMS gave him 2 sprays of nitroglycerin that helped his pain. He has associated shortness of breath. He has a past medical history of myocardial infarction with stent and has a dual-chamber pacemaker. He declines further nitroglycerin this time.   Past Medical History:  Diagnosis Date  . Arthritis   . Chronic kidney disease    CKD -stage 3  . Diabetes mellitus without complication (HCC)   . Diverticulosis   . Dysrhythmia    controlled with pacemaker  . Gout   . Hard of hearing   . History of hiatal hernia   . Hypertension   . IBS (irritable bowel syndrome)   . 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 Presence of permanent cardiac pacemaker 10/17/00, 07/31/07   Medtronic Adapta DR  ADDRL1  . Wears dentures    full upper, partial lower    Patient Active Problem List   Diagnosis Date Noted  . Non-ST elevated myocardial infarction [facility]) 07/02/2016    Past Surgical History:  Procedure Laterality Date  . CATARACT EXTRACTION W/PHACO Left 06/30/2015   Procedure: CATARACT EXTRACTION PHACO AND INTRAOCULAR LENS PLACEMENT (IOC);  Surgeon: Lockie Mola, MD;  Location: [facility];  Service: Ophthalmology;  Laterality: Left;  DIABETIC - oral meds  . CATARACT EXTRACTION W/PHACO Right 07/28/2015   Procedure: CATARACT EXTRACTION PHACO AND INTRAOCULAR LENS PLACEMENT (IOC) right eye;  Surgeon: Lockie Mola, MD;  Location: [facility];  Service:  Ophthalmology;  Laterality: Right;  DIABETIC - oral meds TORIC  . COLONOSCOPY    . CORONARY ANGIOPLASTY  09/08   stent - RDA  . INSERT / REPLACE / REMOVE PACEMAKER  10/17/00, 07/31/07  . NECK SURGERY    . SHOULDER SURGERY     x3  . TONSILLECTOMY      Prior to Admission medications   Medication Sig Start Date End Date Taking? Authorizing Provider  acetaminophen (TYLENOL) 500 MG tablet Take 500 mg by mouth every 6 (six) hours as needed.   Yes Historical Provider, MD  allopurinol (ZYLOPRIM) 100 MG tablet Take 100 mg by mouth daily.   Yes Historical Provider, MD  aspirin 81 MG tablet Take 81 mg by mouth daily.   Yes Historical Provider, MD  carvedilol (COREG) 3.125 MG tablet Take 3.125 mg by mouth 2 (two) times daily with a meal.   Yes Historical Provider, MD  celecoxib (CELEBREX) 200 MG capsule Take 200 mg by mouth daily.   Yes Historical Provider, MD  Cinnamon 500 MG TABS Take by mouth 2 (two) times daily.   Yes Historical Provider, MD  fenofibrate (TRICOR) 145 MG tablet Take 145 mg by mouth daily.   Yes Historical Provider, MD  folic acid (FOLVITE) 1 MG tablet Take 1 mg by mouth daily.   Yes Historical Provider, MD  glimepiride (AMARYL) 4 MG tablet Take 4 mg by mouth 2 (two) times daily.   Yes Historical Provider, MD  hydrochlorothiazide (HYDRODIURIL) 25 MG tablet Take 25  mg by mouth daily.   Yes Historical Provider, MD  lisinopril (PRINIVIL,ZESTRIL) 20 MG tablet Take 20 mg by mouth daily.   Yes Historical Provider, MD  metFORMIN (GLUCOPHAGE) 1000 MG tablet Take 1,000 mg by mouth 2 (two) times daily with a meal.    Yes Historical Provider, MD  Omega-3 Fatty Acids (OMEGA-3 FISH OIL PO) Take by mouth 2 (two) times daily.   Yes Historical Provider, MD  sitaGLIPtin (JANUVIA) 100 MG tablet Take 100 mg by mouth daily.   Yes Historical Provider, MD  traMADol (ULTRAM) 50 MG tablet Take 50 mg by mouth every 4 (four) hours as needed.    Yes Historical Provider, MD     Allergies Simvastatin  History reviewed. No pertinent family history.  Social History Social History  Substance Use Topics  . Smoking status: Never Smoker  . Smokeless tobacco: Never Used  . Alcohol use No    Review of Systems Constitutional: No fever/chills Eyes: No visual changes. ENT: No sore throat. Cardiovascular: Positive chest pain. Respiratory: Positive shortness of breath. Gastrointestinal: No abdominal pain.  No nausea, no vomiting.  No diarrhea.  No constipation. Genitourinary: Negative for dysuria. Musculoskeletal: Negative for back pain. Skin: Negative for rash. Neurological: Negative for headaches, focal weakness or numbness.  10-point ROS otherwise negative.  ____________________________________________   PHYSICAL EXAM:  VITAL SIGNS: ED Triage Vitals  Enc Vitals Group     BP      Pulse      Resp      Temp      Temp src      SpO2      Weight      Height      Head Circumference      Peak Flow      Pain Score      Pain Loc      Pain Edu?      Excl. in GC?     Constitutional: Alert and oriented x 4 well appearing nontoxic no diaphoresis speaks in full, clear sentences Eyes: PERRL EOMI. Head: Atraumatic. Nose: No congestion/rhinnorhea. Mouth/Throat: No trismus Neck: No stridor.   Cardiovascular: Normal rate, regular rhythm. Grossly normal heart sounds.  Good peripheral circulation. Respiratory: Normal respiratory effort.  No retractions. Lungs CTAB and moving good air Gastrointestinal: Soft nondistended nontender no rebound no guarding no peritonitis no McBurney's tenderness negative Rovsing's no costovertebral tenderness negative Murphy's Musculoskeletal: No lower extremity edema   Neurologic:  Normal speech and language. No gross focal neurologic deficits are appreciated. Skin:  Skin is warm, dry and intact. No rash noted. Psychiatric: Mood and affect are normal. Speech and behavior are  normal.    ____________________________________________   DIFFERENTIAL  Acute coronary syndrome, pulmonary embolism, aortic dissection, pneumonia ____________________________________________   LABS (all labs ordered are listed, but only abnormal results are displayed)  Labs Reviewed  BASIC METABOLIC PANEL - Abnormal; Notable for the following:       Result Value   Glucose, Bld 246 (*)    BUN 36 (*)    Creatinine, Ser 1.49 (*)    Calcium 8.8 (*)    GFR calc non Af Amer 43 (*)    GFR calc Af Amer 50 (*)    All other components within normal limits  CBC - Abnormal; Notable for the following:    HCT 39.3 (*)    Platelets 136 (*)    All other components within normal limits  TROPONIN I - Abnormal; Notable for the following:  Troponin I 0.16 (*)    All other components within normal limits  BRAIN NATRIURETIC PEPTIDE - Abnormal; Notable for the following:    B Natriuretic Peptide 129.0 (*)    All other components within normal limits  HEPATIC FUNCTION PANEL - Abnormal; Notable for the following:    ALT 14 (*)    All other components within normal limits  PROTIME-INR  APTT  GLUCOSE, CAPILLARY  HEPARIN LEVEL (UNFRACTIONATED)  TSH  TROPONIN I  TROPONIN I  TROPONIN I  BASIC METABOLIC PANEL  LIPID PANEL    Elevated troponin consistent with myocardial ischemia __________________________________________  EKG  ED ECG REPORT I, Merrily Brittle, the attending physician, personally viewed and interpreted this ECG.  Date: 07/02/2016 Rate: 61 Rhythm: normal sinus rhythm atrial sensed ventricular paced rhythm normal Intervals: normal ST/T Wave abnormalities: normal Conduction Disturbances: Wide complex Narrative Interpretation: Abnormal  ____________________________________________  RADIOLOGY  Chest x-ray with no acute disease ____________________________________________   PROCEDURES  Procedure(s) performed: no  Procedures  Critical Care performed:  yes  CRITICAL CARE Performed by: Merrily Brittle   Total critical care time: 35 minutes  Critical care time was exclusive of separately billable procedures and treating other patients.  Critical care was necessary to treat or prevent imminent or life-threatening deterioration.  Critical care was time spent personally by me on the following activities: development of treatment plan with patient and/or surrogate as well as nursing, discussions with consultants, evaluation of patient's response to treatment, examination of patient, obtaining history from patient or surrogate, ordering and performing treatments and interventions, ordering and review of laboratory studies, ordering and review of radiographic studies, pulse oximetry and re-evaluation of patient's condition.   ____________________________________________   INITIAL IMPRESSION / ASSESSMENT AND PLAN / ED COURSE  Pertinent labs & imaging results that were available during my care of the patient were reviewed by me and considered in my medical decision making (see chart for details).  The patient arrives with typical chest pain and a concerning story. His EKG is abnormal but shows no signs of acute ischemia. No aspirin now's yard he took 324 mg at home. Labs are pending.     ----------------------------------------- 12:46 PM on 07/02/2016 ----------------------------------------- The patient has an elevated troponin in the setting of typical chest pain. I discussed the case with Dr. Lady Gary on call for Dr. Gwen Pounds who will kindly consult on the patient and recommends heparin drip. ____________________________________________   FINAL CLINICAL IMPRESSION(S) / ED DIAGNOSES  Final diagnoses:  NSTEMI (non-ST elevated myocardial infarction) (HCC)      NEW MEDICATIONS STARTED DURING THIS VISIT:  Current Discharge Medication List       Note:  This document was prepared using Dragon voice recognition software and may  include unintentional dictation errors.     Merrily Brittle, MD 07/02/16 [phone]

## 2016-07-02 NOTE — Progress Notes (Signed)
Troponin 14.55. Patient resting comfortably in bed. Currently pain free. MD Anne Hahn notified.  No new orders at this time. Will continue to monitor.   Juan Webster M

## 2016-07-02 NOTE — ED Notes (Signed)
Heparin bolus and continuous infusion verified by Trish Mage.

## 2016-07-02 NOTE — Consult Note (Signed)
[facility] CLINIC CARDIOLOGY A [facility]  CARDIOLOGY CONSULT NOTE  Patient ID: Juan Webster MRN: [id number] DOB/AGE: 1935-06-19 81 y.o.  Admit date: 07/02/2016 Referring Physician Dr. Enedina Finner Primary Physician Dr. Daniel Nones Primary Cardiologist Dr. Gwen Pounds Reason for Consultation nstemi  HPI: Pt is a 81 yo male with history of pci of rca in the past as well as cardiomyopathy with reported ef of 35% with aicd biv pacer inplace. He is admitted after presenting with chest pain with radiation to his left arm. He has ruled in for a nstemi with troponin of 8.82. He is currently stable on hepairn , asa, and pravastatin. Will add metoprolol  Review of Systems  Constitutional: Positive for diaphoresis.  HENT: Negative.   Eyes: Negative.   Respiratory: Negative.   Cardiovascular: Positive for chest pain.  Gastrointestinal: Negative.   Genitourinary: Negative.   Musculoskeletal: Negative.   Skin: Negative.   Neurological: Negative.   Endo/Heme/Allergies: Negative.   Psychiatric/Behavioral: Negative.     Past Medical History:  Diagnosis Date  . Arthritis   . Chronic kidney disease    CKD -stage 3  . Diabetes mellitus without complication (HCC)   . Diverticulosis   . Dysrhythmia    controlled with pacemaker  . Gout   . Hard of hearing   . History of hiatal hernia   . Hypertension   . IBS (irritable bowel syndrome)   . 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 Presence of permanent cardiac pacemaker 10/17/00, 07/31/07   Medtronic Adapta DR  ADDRL1  . Wears dentures    full upper, partial lower    History reviewed. No pertinent family history.  Social History   Social History  . Marital status: Married    Spouse name: N/A  . Number of children: N/A  . Years of education: N/A   Occupational History  . Not on file.   Social History Main Topics  . Smoking status: Never Smoker  . Smokeless tobacco: Never Used  . Alcohol use No  . Drug use:  Unknown  . Sexual activity: Not on file   Other Topics Concern  . Not on file   Social History Narrative  . No narrative on file    Past Surgical History:  Procedure Laterality Date  . CATARACT EXTRACTION W/PHACO Left 06/30/2015   Procedure: CATARACT EXTRACTION PHACO AND INTRAOCULAR LENS PLACEMENT (IOC);  Surgeon: Lockie Mola, MD;  Location: [facility];  Service: Ophthalmology;  Laterality: Left;  DIABETIC - oral meds  . CATARACT EXTRACTION W/PHACO Right 07/28/2015   Procedure: CATARACT EXTRACTION PHACO AND INTRAOCULAR LENS PLACEMENT (IOC) right eye;  Surgeon: Lockie Mola, MD;  Location: [facility];  Service: Ophthalmology;  Laterality: Right;  DIABETIC - oral meds TORIC  . COLONOSCOPY    . CORONARY ANGIOPLASTY  09/08   stent - RDA  . INSERT / REPLACE / REMOVE PACEMAKER  10/17/00, 07/31/07  . NECK SURGERY    . SHOULDER SURGERY     x3  . TONSILLECTOMY       Prescriptions Prior to Admission  Medication Sig Dispense Refill Last Dose  . acetaminophen (TYLENOL) 500 MG tablet Take 500 mg by mouth every 6 (six) hours as needed.   prn at prn  . allopurinol (ZYLOPRIM) 100 MG tablet Take 100 mg by mouth daily.   07/02/2016 at 0930  . aspirin 81 MG tablet Take 81 mg by mouth daily.   07/01/2016 at 2100  .  carvedilol (COREG) 3.125 MG tablet Take 3.125 mg by mouth 2 (two) times daily with a meal.   07/02/2016 at 0930  . celecoxib (CELEBREX) 200 MG capsule Take 200 mg by mouth daily.   07/02/2016 at 0930  . Cinnamon 500 MG TABS Take by mouth 2 (two) times daily.   07/02/2016 at 0930  . fenofibrate (TRICOR) 145 MG tablet Take 145 mg by mouth daily.   07/01/2016 at 2100  . folic acid (FOLVITE) 1 MG tablet Take 1 mg by mouth daily.   07/02/2016 at 0930  . glimepiride (AMARYL) 4 MG tablet Take 4 mg by mouth 2 (two) times daily.   07/02/2016 at 0930  . hydrochlorothiazide (HYDRODIURIL) 25 MG tablet Take 25 mg by mouth daily.   07/02/2016 at 0930  . lisinopril  (PRINIVIL,ZESTRIL) 20 MG tablet Take 20 mg by mouth daily.   07/02/2016 at 0930  . metFORMIN (GLUCOPHAGE) 1000 MG tablet Take 1,000 mg by mouth 2 (two) times daily with a meal.    07/02/2016 at 0930  . Omega-3 Fatty Acids (OMEGA-3 FISH OIL PO) Take by mouth 2 (two) times daily.   07/02/2016 at 0930  . sitaGLIPtin (JANUVIA) 100 MG tablet Take 100 mg by mouth daily.   07/01/2016 at 2100  . traMADol (ULTRAM) 50 MG tablet Take 50 mg by mouth every 4 (four) hours as needed.    unknown at unknown    Physical Exam: Blood pressure (!) 143/55, pulse 60, temperature 98.2 F (36.8 C), temperature source Oral, resp. rate 18, height  (1.854 m), weight 90.4 kg (199 lb 3.2 oz), SpO2 96 %.   Wt Readings from Last 1 Encounters:  07/02/16 90.4 kg (199 lb 3.2 oz)     General appearance: alert and cooperative Head: Normocephalic, without obvious abnormality, atraumatic Resp: clear to auscultation bilaterally Cardio: regular rate and rhythm GI: soft, non-tender; bowel sounds normal; no masses,  no organomegaly Extremities: extremities normal, atraumatic, no cyanosis or edema Neurologic: Grossly normal  Labs:   Lab Results  Component Value Date   WBC 6.9 07/02/2016   HGB 13.6 07/02/2016   HCT 39.3 (L) 07/02/2016   MCV 86.6 07/02/2016   PLT 136 (L) 07/02/2016    Recent Labs Lab 07/02/16 1134  NA 136  K 4.5  CL 104  CO2 23  BUN 36*  CREATININE 1.49*  CALCIUM 8.8*  PROT 6.6  BILITOT 0.8  ALKPHOS 57  ALT 14*  AST 31  GLUCOSE 246*   Lab Results  Component Value Date   TROPONINI 8.82 (HH) 07/02/2016      Radiology: no acute cardiopulmonary disease.  EKG: v paced rhythm  ASSESSMENT AND PLAN:  81 yo male with history of cad s/p pci of rca in the past, history of sss s/p ppm who was admitted after several weeks of intermitnt chet pain. Had a severe episode lasting approximately 2 hours before presenting to the er. Initial troponin was 0.16 with subsequent level of 8.82. He is currently  paiin free. EF is  35% percent by outpatient echo. Will need to proceed with left cardiac cath to evaluate anatomy as he has post mi chest pain after ruling in for nstemi. Further recs after cath. Continue with asa and heparin.  Signed: Dalia Heading MD, [facility] 07/02/2016, 8:42 PM

## 2016-07-02 NOTE — H&P (Signed)
[employer] - [place] at [facility]   PATIENT NAME: Juan Webster    MR#:  [id number]  DATE OF BIRTH:  26-Jul-1935  DATE OF ADMISSION:  07/02/2016  PRIMARY CARE PHYSICIAN: Lynnea Ferrier, MD   REQUESTING/REFERRING PHYSICIAN:Neil Rifenbark MD  CHIEF COMPLAINT:   Chief Complaint  Patient presents with  . Chest Pain    HISTORY OF PRESENT ILLNESS: Juan Webster  is a 81 y.o. male with a known history of CAD with stent,Hypertension, diabetes type 2 was presenting with a complaint of ongoing chest pain for the past few weeks. He was seen by his primary care provider as well. Thought to have possible GI related pain. Patient earlier today started having substernal pressure-like sensation in the chest as well as upper epigastric region. It lasted for a few hours. He also had pain radiating to the left arm. Patient came to the emergency room with the symptoms was noted to have abnormal troponin of 0.16. Emergency room physician spoke with Dr. Ihor Austin of cardiology who recommended patient be started on heparin and will see the patient. PAST MEDICAL HISTORY:   Past Medical History:  Diagnosis Date  . Arthritis   . Chronic kidney disease    CKD -stage 3  . Diabetes mellitus without complication (HCC)   . Diverticulosis   . Dysrhythmia    controlled with pacemaker  . Gout   . Hard of hearing   . History of hiatal hernia   . Hypertension   . IBS (irritable bowel syndrome)   . 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 Presence of permanent cardiac pacemaker 10/17/00, 07/31/07   [employer] Adapta DR  ADDRL1  . Wears dentures    full upper, partial lower    PAST SURGICAL HISTORY: Past Surgical History:  Procedure Laterality Date  . CATARACT EXTRACTION W/PHACO Left 06/30/2015   Procedure: CATARACT EXTRACTION PHACO AND INTRAOCULAR LENS PLACEMENT (IOC);  Surgeon: Lockie Mola, MD;  Location: [facility];  Service: Ophthalmology;  Laterality: Left;  DIABETIC - oral meds  .  CATARACT EXTRACTION W/PHACO Right 07/28/2015   Procedure: CATARACT EXTRACTION PHACO AND INTRAOCULAR LENS PLACEMENT (IOC) right eye;  Surgeon: Lockie Mola, MD;  Location: [facility];  Service: Ophthalmology;  Laterality: Right;  DIABETIC - oral meds TORIC  . COLONOSCOPY    . CORONARY ANGIOPLASTY  09/08   stent - RDA  . INSERT / REPLACE / REMOVE PACEMAKER  10/17/00, 07/31/07  . NECK SURGERY    . SHOULDER SURGERY     x3  . TONSILLECTOMY      SOCIAL HISTORY:  Social History  Substance Use Topics  . Smoking status: Never Smoker  . Smokeless tobacco: Never Used  . Alcohol use No    FAMILY HISTORY: No family history on file.  DRUG ALLERGIES:  Allergies  Allergen Reactions  . Simvastatin     Muscle weakness    REVIEW OF SYSTEMS:   CONSTITUTIONAL: No fever, fatigue or weakness.  EYES: No blurred or double vision.  EARS, NOSE, AND THROAT: No tinnitus or ear pain.  RESPIRATORY: No cough, shortness of breath, wheezing or hemoptysis.  CARDIOVASCULAR: Positive chest pain, orthopnea, edema.  GASTROINTESTINAL: No nausea, vomiting, diarrhea or abdominal pain.  GENITOURINARY: No dysuria, hematuria.  ENDOCRINE: No polyuria, nocturia,  HEMATOLOGY: No anemia, easy bruising or bleeding SKIN: No rash or lesion. MUSCULOSKELETAL: No joint pain or arthritis.   NEUROLOGIC: No tingling, numbness, weakness.  PSYCHIATRY: No anxiety or depression.  MEDICATIONS AT HOME:  Prior to Admission medications   Medication Sig Start Date End Date Taking? Authorizing Provider  acetaminophen (TYLENOL) 500 MG tablet Take 500 mg by mouth every 6 (six) hours as needed.    Historical Provider, MD  allopurinol (ZYLOPRIM) 100 MG tablet Take 100 mg by mouth daily.    Historical Provider, MD  aspirin 81 MG tablet Take 81 mg by mouth daily.    Historical Provider, MD  celecoxib (CELEBREX) 200 MG capsule Take 200 mg by mouth daily.    Historical Provider, MD  Cinnamon 500 MG TABS Take by mouth 2  (two) times daily.    Historical Provider, MD  cyclobenzaprine (FLEXERIL) 10 MG tablet Take 10 mg by mouth 3 (three) times daily as needed for muscle spasms. Reported on 07/28/2015    Historical Provider, MD  fenofibrate (TRICOR) 145 MG tablet Take 145 mg by mouth daily.    Historical Provider, MD  folic acid (FOLVITE) 1 MG tablet Take 1 mg by mouth daily.    Historical Provider, MD  glimepiride (AMARYL) 4 MG tablet Take 4 mg by mouth 2 (two) times daily.    Historical Provider, MD  hydrochlorothiazide (HYDRODIURIL) 25 MG tablet Take 25 mg by mouth daily.    Historical Provider, MD  lisinopril (PRINIVIL,ZESTRIL) 20 MG tablet Take 20 mg by mouth daily.    Historical Provider, MD  metFORMIN (GLUCOPHAGE) 1000 MG tablet Take 1,000 mg by mouth daily.    Historical Provider, MD  Omega-3 Fatty Acids (OMEGA-3 FISH OIL PO) Take by mouth 2 (two) times daily.    Historical Provider, MD  saxagliptin HCl (ONGLYZA) 5 MG TABS tablet Take 5 mg by mouth daily.    Historical Provider, MD  traMADol (ULTRAM) 50 MG tablet Take by mouth every 6 (six) hours as needed.    Historical Provider, MD      PHYSICAL EXAMINATION:   VITAL SIGNS: Blood pressure (!) 157/68, pulse (!) 59, temperature 98.2 F (36.8 C), temperature source Oral, resp. rate 16, height  (1.854 m), weight 206 lb (93.4 kg), SpO2 98 %.  GENERAL:  81 y.o.-year-old patient lying in the bed with no acute distress.  EYES: Pupils equal, round, reactive to light and accommodation. No scleral icterus. Extraocular muscles intact.  HEENT: Head atraumatic, normocephalic. Oropharynx and nasopharynx clear.  NECK:  Supple, no jugular venous distention. No thyroid enlargement, no tenderness.  LUNGS: Normal breath sounds bilaterally, no wheezing, rales,rhonchi or crepitation. No use of accessory muscles of respiration.  CARDIOVASCULAR: S1, S2 normal. No murmurs, rubs, or gallops.  ABDOMEN: Soft, nontender, nondistended. Bowel sounds present. No organomegaly or  mass.  EXTREMITIES: No pedal edema, cyanosis, or clubbing.  NEUROLOGIC: Cranial nerves II through XII are intact. Muscle strength 5/5 in all extremities. Sensation intact. Gait not checked.  PSYCHIATRIC: The patient is alert and oriented x 3.  SKIN: No obvious rash, lesion, or ulcer.   LABORATORY PANEL:   CBC  Recent Labs Lab 07/02/16 1134  WBC 6.9  HGB 13.6  HCT 39.3*  PLT 136*  MCV 86.6  MCH 29.9  MCHC 34.6  RDW 13.5   ------------------------------------------------------------------------------------------------------------------  Chemistries   Recent Labs Lab 07/02/16 1134  NA 136  K 4.5  CL 104  CO2 23  GLUCOSE 246*  BUN 36*  CREATININE 1.49*  CALCIUM 8.8*  AST 31  ALT 14*  ALKPHOS 57  BILITOT 0.8   ------------------------------------------------------------------------------------------------------------------ estimated creatinine clearance is 45.4 mL/min (A) (by C-G formula based on SCr of  1.49 mg/dL (H)). ------------------------------------------------------------------------------------------------------------------ No results for input(s): TSH, T4TOTAL, T3FREE, THYROIDAB in the last 72 hours.  Invalid input(s): FREET3   Coagulation profile No results for input(s): INR, PROTIME in the last 168 hours. ------------------------------------------------------------------------------------------------------------------- No results for input(s): DDIMER in the last 72 hours. -------------------------------------------------------------------------------------------------------------------  Cardiac Enzymes  Recent Labs Lab 07/02/16 1134  TROPONINI 0.16*   ------------------------------------------------------------------------------------------------------------------ Invalid input(s): POCBNP  ---------------------------------------------------------------------------------------------------------------  Urinalysis No results found for:  COLORURINE, APPEARANCEUR, LABSPEC, PHURINE, GLUCOSEU, HGBUR, BILIRUBINUR, KETONESUR, PROTEINUR, UROBILINOGEN, NITRITE, LEUKOCYTESUR   RADIOLOGY: Dg Chest 2 View  Result Date: 07/02/2016 CLINICAL DATA:  Intermittent chest pain for the past 2-3 months. EXAM: CHEST  2 VIEW COMPARISON:  10/25/2010. FINDINGS: Normal sized heart. Tortuous aorta. Stable left subclavian pacemaker leads. Clear lungs with normal vascularity. Cervical spine fixation hardware. Thoracic spine degenerative changes. IMPRESSION: No acute abnormality. Electronically Signed   By: Beckie Salts M.D.   On: 07/02/2016 12:03    EKG: Orders placed or performed during the hospital encounter of 07/02/16  . ED EKG within 10 minutes  . ED EKG within 10 minutes    IMPRESSION AND PLAN: Patient is a 81 year old white male presenting to the emergency room with chest pain  1. Non-ST MI I will continue patient on aspirin therapy Continue heparin drip Cardiology has been notified of an admission for the recommendation workup per them  2. Diabetes type 2 I'll discontinue metformin due to elevated creatinine Place on sliding scale insulin continue other oral regimen  3. Gout I will continue his allopurinol  4. Chronic kidney disease will hydrate him for possible cardiac catheterization  5. Essential hypertension continue lisinopril  6. Miscellaneous he'll be on heparin drip should be sufficient for DVT prophylaxis     All the records are reviewed and case discussed with ED provider. Management plans discussed with the patient, family and they are in agreement.  CODE STATUS: Code Status History    This patient does not have a recorded code status. Please follow your organizational policy for patients in this situation.    Advance Directive Documentation     Most Recent Value  Type of Advance Directive  Healthcare Power of Attorney, Living will  Pre-existing out of facility DNR order (yellow form or pink MOST form)  -   "MOST" Form in Place?  -       TOTAL TIME TAKING CARE OF THIS PATIENT:55 minutes.    Auburn Bilberry M.D on 07/02/2016 at 1:05 PM  Between 7am to 6pm - Pager - [phone]  After 6pm go to [email or web address] - password [email or web address] [facility]  [place] [facility]  Office  [phone]  CC: Primary care physician; Lynnea Ferrier, MD

## 2016-07-03 ENCOUNTER — Encounter: Payer: Self-pay | Admitting: Cardiology

## 2016-07-03 ENCOUNTER — Encounter
Admission: EM | Disposition: A | Discharge status: Home / Self Care | Payer: Self-pay | Source: Home / Self Care | Attending: Internal Medicine

## 2016-07-03 HISTORY — PX: LEFT HEART CATH AND CORONARY ANGIOGRAPHY: CATH118249

## 2016-07-03 LAB — CBC
HCT: 40.2 % (ref 40.0–52.0)
Hemoglobin: 13.6 g/dL (ref 13.0–18.0)
MCH: 29.5 pg (ref 26.0–34.0)
MCHC: 33.9 g/dL (ref 32.0–36.0)
MCV: 87 fL (ref 80.0–100.0)
PLATELETS: 130 10*3/uL — AB (ref 150–440)
RBC: 4.62 MIL/uL (ref 4.40–5.90)
RDW: 13.8 % (ref 11.5–14.5)
WBC: 8 10*3/uL (ref 3.8–10.6)

## 2016-07-03 LAB — LIPID PANEL
Cholesterol: 152 mg/dL (ref 0–200)
HDL: 34 mg/dL — AB (ref >40)
LDL Cholesterol: 55 mg/dL (ref 0–99)
TRIGLYCERIDES: 315 mg/dL — AB (ref <150)
Total CHOL/HDL Ratio: 4.5 RATIO
VLDL: 63 mg/dL — AB (ref 0–40)

## 2016-07-03 LAB — BASIC METABOLIC PANEL
ANION GAP: 6 (ref 5–15)
BUN: 32 mg/dL — ABNORMAL HIGH (ref 6–20)
CALCIUM: 9 mg/dL (ref 8.9–10.3)
CO2: 26 mmol/L (ref 22–32)
CREATININE: 1.39 mg/dL — AB (ref 0.61–1.24)
Chloride: 106 mmol/L (ref 101–111)
GFR, EST AFRICAN AMERICAN: 54 mL/min — AB (ref >60)
GFR, EST NON AFRICAN AMERICAN: 47 mL/min — AB (ref >60)
Glucose, Bld: 115 mg/dL — ABNORMAL HIGH (ref 65–99)
Potassium: 4.4 mmol/L (ref 3.5–5.1)
Sodium: 138 mmol/L (ref 135–145)

## 2016-07-03 LAB — HEPARIN LEVEL (UNFRACTIONATED): Heparin Unfractionated: 0.45 IU/mL (ref 0.30–0.70)

## 2016-07-03 LAB — GLUCOSE, CAPILLARY
GLUCOSE-CAPILLARY: 131 mg/dL — AB (ref 65–99)
Glucose-Capillary: 175 mg/dL — ABNORMAL HIGH (ref 65–99)
Glucose-Capillary: 91 mg/dL (ref 65–99)

## 2016-07-03 LAB — TROPONIN I: TROPONIN I: 10.61 ng/mL — AB (ref <0.03)

## 2016-07-03 SURGERY — LEFT HEART CATH AND CORONARY ANGIOGRAPHY
Anesthesia: Moderate Sedation

## 2016-07-03 MED ORDER — ENOXAPARIN SODIUM 40 MG/0.4ML ~~LOC~~ SOLN
40.0000 mg | SUBCUTANEOUS | Status: DC
  Administered 2016-07-03: 40 mg via SUBCUTANEOUS
  Filled 2016-07-03: qty 0.4

## 2016-07-03 MED ORDER — IOPAMIDOL (ISOVUE-300) INJECTION 61%
INTRAVENOUS | Status: DC | PRN
  Administered 2016-07-03: 110 mL via INTRAVENOUS

## 2016-07-03 MED ORDER — HEPARIN (PORCINE) IN NACL 2-0.9 UNIT/ML-% IJ SOLN
INTRAMUSCULAR | Status: AC
  Filled 2016-07-03: qty 500

## 2016-07-03 MED ORDER — MIDAZOLAM HCL 2 MG/2ML IJ SOLN
INTRAMUSCULAR | Status: AC
  Filled 2016-07-03: qty 2

## 2016-07-03 MED ORDER — METOPROLOL TARTRATE 25 MG PO TABS: 25.0000 mg | ORAL_TABLET | Freq: Two times a day (BID) | ORAL | Status: DC

## 2016-07-03 MED ORDER — LIDOCAINE HCL (PF) 1 % IJ SOLN
INTRAMUSCULAR | Status: AC
  Filled 2016-07-03: qty 30

## 2016-07-03 MED ORDER — FENTANYL CITRATE (PF) 100 MCG/2ML IJ SOLN
INTRAMUSCULAR | Status: AC
  Filled 2016-07-03: qty 2

## 2016-07-03 MED ORDER — FENTANYL CITRATE (PF) 100 MCG/2ML IJ SOLN
INTRAMUSCULAR | Status: DC | PRN
  Administered 2016-07-03: 50 ug via INTRAVENOUS

## 2016-07-03 MED ORDER — MIDAZOLAM HCL 2 MG/2ML IJ SOLN
INTRAMUSCULAR | Status: DC | PRN
  Administered 2016-07-03: 1 mg via INTRAVENOUS

## 2016-07-03 MED ORDER — ISOSORBIDE MONONITRATE ER 30 MG PO TB24
30.0000 mg | ORAL_TABLET | Freq: Every day | ORAL | Status: DC
  Administered 2016-07-03 – 2016-07-04 (×2): 30 mg via ORAL
  Filled 2016-07-03 (×2): qty 1

## 2016-07-03 SURGICAL SUPPLY — 11 items
CATH 5FR JR4 DIAGNOSTIC (CATHETERS) ×2 IMPLANT
CATH 5FR PIGTAIL DIAGNOSTIC (CATHETERS) ×2 IMPLANT
CATH INFINITI 5FR JL4 (CATHETERS) ×2 IMPLANT
CATH INFINITI 5FR JL5 (CATHETERS) ×2 IMPLANT
DEVICE CLOSURE MYNXGRIP 5F (Vascular Products) ×2 IMPLANT
DEVICE SAFEGUARD 24CM (GAUZE/BANDAGES/DRESSINGS) ×2 IMPLANT
KIT MANI 3VAL PERCEP (MISCELLANEOUS) ×2 IMPLANT
NEEDLE PERC 18GX7CM (NEEDLE) ×2 IMPLANT
PACK CARDIAC CATH (CUSTOM PROCEDURE TRAY) ×2 IMPLANT
SHEATH AVANTI 5FR X 11CM (SHEATH) ×2 IMPLANT
WIRE EMERALD 3MM-J .035X150CM (WIRE) ×2 IMPLANT

## 2016-07-03 NOTE — Progress Notes (Signed)
Pt remains stable no bleeding nor hematoma at right groin site. Report called to [employer] with plan reviewed, questions answered.

## 2016-07-03 NOTE — Care Management (Signed)
Admitted with nstemi/  From home.  Cardiac cath without intervention.  At present No discharge needs identified by members of the care team

## 2016-07-03 NOTE — Progress Notes (Signed)
Patient clinically stable post heart cath. Wife at bedside. Dr Lady Gary out to speak with family and patient with questions answered.

## 2016-07-03 NOTE — Progress Notes (Signed)
       [facility]  SUBJECTIVE: no further chest pain   Vitals:   07/03/16 1215 07/03/16 1230 07/03/16 1242 07/03/16 1245  BP: (!) 163/68 (!) 153/104 (!) 154/59 (!) 154/59  Pulse:    61  Resp: Temp:      TempSrc:      SpO2:  98%  96%  Weight:      Height:        Intake/Output Summary (Last 24 hours) at 07/03/16 1259 Last data filed at 07/03/16 0805  Gross per 24 hour  Intake              669 ml  Output             2650 ml  Net            -1981 ml    LABS: Basic Metabolic Panel:  Recent Labs  56/21/30 1134 07/03/16 0445  NA 136 138  K 4.5 4.4  CL 104 106  CO2 23 26  GLUCOSE 246* 115*  BUN 36* 32*  CREATININE 1.49* 1.39*  CALCIUM 8.8* 9.0   Liver Function Tests:  Recent Labs  07/02/16 1134  AST 31  ALT 14*  ALKPHOS 57  BILITOT 0.8  PROT 6.6  ALBUMIN 3.8   No results for input(s): LIPASE, AMYLASE in the last 72 hours. CBC:  Recent Labs  07/02/16 1134 07/03/16 0445  WBC 6.9 8.0  HGB 13.6 13.6  HCT 39.3* 40.2  MCV 86.6 87.0  PLT 136* 130*   Cardiac Enzymes:  Recent Labs  07/02/16 1559 07/02/16 2144 07/03/16 0335  TROPONINI 8.82* 14.55* 10.61*   BNP: Invalid input(s): POCBNP D-Dimer: No results for input(s): DDIMER in the last 72 hours. Hemoglobin A1C: No results for input(s): HGBA1C in the last 72 hours. Fasting Lipid Panel:  Recent Labs  07/03/16 0445  CHOL 152  HDL 34*  LDLCALC 55  TRIG 865*  CHOLHDL 4.5   Thyroid Function Tests:  Recent Labs  07/02/16 1559  TSH 0.635   Anemia Panel: No results for input(s): VITAMINB12, FOLATE, FERRITIN, TIBC, IRON, RETICCTPCT in the last 72 hours.   Physical Exam: Blood pressure (!) 154/59, pulse 61, temperature 98.5 F (36.9 C), temperature source Oral, resp. rate 13, height  (1.854 m), weight 90.3 kg (199 lb), SpO2 96 %.   Wt Readings from Last 1 Encounters:  07/03/16 90.3 kg (199 lb)     General appearance:  alert and cooperative Resp: clear to auscultation bilaterally Cardio: regular rate and rhythm GI: soft, non-tender; bowel sounds normal; no masses,  no organomegaly Extremities: extremities normal, atraumatic, no cyanosis or edema Neurologic: Grossly normal  TELEMETRY: Reviewed telemetry pt in nsr:  ASSESSMENT AND PLAN:  Active Problems:   Non-ST elevated myocardial infarction (HCC)-cath revealed no significant cad. Stent in rca and lad patent. Cause of elevated troponin unclear but does not appear to be due to acs. Small vessel disease may be present. Dysrhythmia may be  palying a role. Would continue with current meds. Consider holter as outpatient. Ambulate and consider discharge today with outpatinet follow up with Dr. Orson Eva, MD, [facility] 07/03/2016 12:59 PM

## 2016-07-03 NOTE — Care Management Important Message (Signed)
Important Message  Patient Details  Name: Juan Webster MRN: [id number] Date of Birth: [birth year]   Medicare Important Message Given:  Yes Signed IM notice given    Eber Hong, RN 07/03/2016, 5:24 PM

## 2016-07-03 NOTE — Progress Notes (Signed)
SLP Cancellation Note  Patient Details Name: Juan Webster MRN: [id number] DOB: 1935/11/24   Cancelled treatment:       Reason Eval/Treat Not Completed: SLP screened, no needs identified, will sign off (chart reviewed; consulted NSG then pt and family in room). Pt denied any difficulty swallowing all foods "except for some cornbreads and doughy bread"; "I can eat my wife's cornbread and biscuits as long as I take the doughy center part out first". Pt indicated if feels like the certain breads can "hang up" in his throat. He is currently on a regular diet w/ no overt s/s of aspiration noted w/ meals here while admitted; tolerates swallowing pills w/ water per NSG. Pt conversed at conversational level w/out deficits noted.  Gave general discussion on general aspiration precautions including moistening foods well, and recommended f/u w/ GI if the issue w/ breads increases and/or it begins to involve other solids foods such as meats(which he currently denies problems with). Pt stated he has seen a GI in the past and that this issue has been ongoing "for years".  No further skilled ST services indicated as pt appears at his baseline. Pt, family agreed. NSG to reconsult if any change in status.     Jerilynn Som, MS, CCC-SLP Watson,Katherine 07/03/2016, 9:36 AM

## 2016-07-03 NOTE — Progress Notes (Signed)
[employer] -  at [facility]   PATIENT NAME: Juan Webster    MR#:  [id number]  DATE OF BIRTH:  [birth year]  SUBJECTIVE:  CHIEF COMPLAINT:   Chief Complaint  Patient presents with  . Chest Pain   No further CP  REVIEW OF SYSTEMS:    Review of Systems  Constitutional: Negative for chills and fever.  HENT: Negative for sore throat.   Eyes: Negative for blurred vision, double vision and pain.  Respiratory: Negative for cough, hemoptysis, shortness of breath and wheezing.   Cardiovascular: Positive for chest pain. Negative for palpitations, orthopnea and leg swelling.  Gastrointestinal: Negative for abdominal pain, constipation, diarrhea, heartburn, nausea and vomiting.  Genitourinary: Negative for dysuria and hematuria.  Musculoskeletal: Negative for back pain and joint pain.  Skin: Negative for rash.  Neurological: Negative for sensory change, speech change, focal weakness and headaches.  Endo/Heme/Allergies: Does not bruise/bleed easily.  Psychiatric/Behavioral: Negative for depression. The patient is not nervous/anxious.     DRUG ALLERGIES:   Allergies  Allergen Reactions  . Simvastatin     Muscle weakness    VITALS:  Blood pressure 92/71, pulse 60, temperature 98.4 F (36.9 C), temperature source Oral, resp. rate 16, height  (1.854 m), weight 90.3 kg (199 lb), SpO2 98 %.  PHYSICAL EXAMINATION:   Physical Exam  GENERAL:  81 y.o.-year-old patient lying in the bed with no acute distress.  EYES: Pupils equal, round, reactive to light and accommodation. No scleral icterus. Extraocular muscles intact.  HEENT: Head atraumatic, normocephalic. Oropharynx and nasopharynx clear.  NECK:  Supple, no jugular venous distention. No thyroid enlargement, no tenderness.  LUNGS: Normal breath sounds bilaterally, no wheezing, rales, rhonchi. No use of accessory muscles of respiration.  CARDIOVASCULAR: S1, S2 normal. No murmurs, rubs, or gallops.  ABDOMEN: Soft,  nontender, nondistended. Bowel sounds present. No organomegaly or mass.  EXTREMITIES: No cyanosis, clubbing or edema b/l.    NEUROLOGIC: Cranial nerves II through XII are intact. No focal Motor or sensory deficits b/l.   PSYCHIATRIC: The patient is alert and oriented x 3.  SKIN: No obvious rash, lesion, or ulcer.   LABORATORY PANEL:   CBC  Recent Labs Lab 07/03/16 0445  WBC 8.0  HGB 13.6  HCT 40.2  PLT 130*   ------------------------------------------------------------------------------------------------------------------ Chemistries   Recent Labs Lab 07/02/16 1134 07/03/16 0445  NA 136 138  K 4.5 4.4  CL 104 106  CO2 23 26  GLUCOSE 246* 115*  BUN 36* 32*  CREATININE 1.49* 1.39*  CALCIUM 8.8* 9.0  AST 31  --   ALT 14*  --   ALKPHOS 57  --   BILITOT 0.8  --    ------------------------------------------------------------------------------------------------------------------  Cardiac Enzymes  Recent Labs Lab 07/03/16 0335  TROPONINI 10.61*   ------------------------------------------------------------------------------------------------------------------  RADIOLOGY:  Dg Chest 2 View  Result Date: 07/02/2016 CLINICAL DATA:  Intermittent chest pain for the past 2-3 months. EXAM: CHEST  2 VIEW COMPARISON:  10/25/2010. FINDINGS: Normal sized heart. Tortuous aorta. Stable left subclavian pacemaker leads. Clear lungs with normal vascularity. Cervical spine fixation hardware. Thoracic spine degenerative changes. IMPRESSION: No acute abnormality. Electronically Signed   By: Beckie Salts M.D.   On: 07/02/2016 12:03     ASSESSMENT AND PLAN:   * NSTEMI ON ASA, Statin, coreg. Will add Imdur Discussed with Dr. Lady Gary Cath showed no new lesions. Likely smaller vessel issue or arrythmia. Monitor on tele overnight  * HTN Continue meds Hold HCTZ now that we added imdur  DVT prophylaxis Lovenox   All the records are reviewed and case discussed with Care  Management/Social Workerr. Management plans discussed with the patient, family and they are in agreement.  CODE STATUS: FULL CODE  DVT Prophylaxis: SCDs  TOTAL TIME TAKING CARE OF THIS PATIENT: 35 minutes.   POSSIBLE D/C IN  DAYS, DEPENDING ON CLINICAL CONDITION.  Milagros Loll R M.D on 07/03/2016 at 3:17 PM  Between 7am to 6pm - Pager - [phone]  After 6pm go to [email or web address] - password [email or web address] [facility]  [facility] [place] [facility]  Office  [phone]  CC: Primary care physician; Lynnea Ferrier, MD  Note: This dictation was prepared with Dragon dictation along with smaller phrase technology. Any transcriptional errors that result from this process are unintentional.

## 2016-07-03 NOTE — Progress Notes (Signed)
Patient requesting home medications. MD Anne Hahn notified. Per MD we are going to hold oral hypoglycemics due to patient going for cardiac cath in the morning. Patient educated. All questions answered. Will continue to monitor.   Mayra Neer M

## 2016-07-04 LAB — CBC
HCT: 41.7 % (ref 40.0–52.0)
Hemoglobin: 14.1 g/dL (ref 13.0–18.0)
MCH: 29.3 pg (ref 26.0–34.0)
MCHC: 33.8 g/dL (ref 32.0–36.0)
MCV: 86.8 fL (ref 80.0–100.0)
PLATELETS: 131 10*3/uL — AB (ref 150–440)
RBC: 4.81 MIL/uL (ref 4.40–5.90)
RDW: 13.7 % (ref 11.5–14.5)
WBC: 8.1 10*3/uL (ref 3.8–10.6)

## 2016-07-04 LAB — HEPARIN LEVEL (UNFRACTIONATED): Heparin Unfractionated: 0.13 IU/mL — ABNORMAL LOW (ref 0.30–0.70)

## 2016-07-04 LAB — GLUCOSE, CAPILLARY: Glucose-Capillary: 175 mg/dL — ABNORMAL HIGH (ref 65–99)

## 2016-07-04 MED ORDER — ISOSORBIDE MONONITRATE ER 30 MG PO TB24: 30.0000 mg | ORAL_TABLET | Freq: Every day | ORAL | 0 refills | Status: DC

## 2016-07-04 MED ORDER — LISINOPRIL 20 MG PO TABS: 10.0000 mg | ORAL_TABLET | Freq: Every day | ORAL | Status: DC

## 2016-07-04 MED ORDER — CARVEDILOL 3.125 MG PO TABS
3.1250 mg | ORAL_TABLET | Freq: Two times a day (BID) | ORAL | Status: DC
  Administered 2016-07-04: 3.125 mg via ORAL
  Filled 2016-07-04: qty 1

## 2016-07-04 MED ORDER — LISINOPRIL 10 MG PO TABS
10.0000 mg | ORAL_TABLET | Freq: Every day | ORAL | Status: DC
  Administered 2016-07-04: 10 mg via ORAL
  Filled 2016-07-04: qty 1

## 2016-07-04 NOTE — Progress Notes (Signed)
[facility]  SUBJECTIVE: no further chest pain   Vitals:   07/03/16 1327 07/03/16 1643 07/03/16 2013 07/04/16 0512  BP: 92/71 (!) 118/40 (!) 132/46 138/69  Pulse: 60 63 62 70  Resp: Temp: 98.4 F (36.9 C) 98 F (36.7 C) 98 F (36.7 C) 97.9 F (36.6 C)  TempSrc: Oral  Oral Oral  SpO2: 98% 95% 97% 97%  Weight:      Height:        Intake/Output Summary (Last 24 hours) at 07/04/16 1610 Last data filed at 07/03/16 2348  Gross per 24 hour  Intake                0 ml  Output             1150 ml  Net            -1150 ml    LABS: Basic Metabolic Panel:  Recent Labs  96/04/54 1134 07/03/16 0445  NA 136 138  K 4.5 4.4  CL 104 106  CO2 23 26  GLUCOSE 246* 115*  BUN 36* 32*  CREATININE 1.49* 1.39*  CALCIUM 8.8* 9.0   Liver Function Tests:  Recent Labs  07/02/16 1134  AST 31  ALT 14*  ALKPHOS 57  BILITOT 0.8  PROT 6.6  ALBUMIN 3.8   No results for input(s): LIPASE, AMYLASE in the last 72 hours. CBC:  Recent Labs  07/03/16 0445 07/04/16 0618  WBC 8.0 8.1  HGB 13.6 14.1  HCT 40.2 41.7  MCV 87.0 86.8  PLT 130* 131*   Cardiac Enzymes:  Recent Labs  07/02/16 1559 07/02/16 2144 07/03/16 0335  TROPONINI 8.82* 14.55* 10.61*   BNP: Invalid input(s): POCBNP D-Dimer: No results for input(s): DDIMER in the last 72 hours. Hemoglobin A1C: No results for input(s): HGBA1C in the last 72 hours. Fasting Lipid Panel:  Recent Labs  07/03/16 0445  CHOL 152  HDL 34*  LDLCALC 55  TRIG 098*  CHOLHDL 4.5   Thyroid Function Tests:  Recent Labs  07/02/16 1559  TSH 0.635   Anemia Panel: No results for input(s): VITAMINB12, FOLATE, FERRITIN, TIBC, IRON, RETICCTPCT in the last 72 hours.   Physical Exam: Blood pressure 138/69, pulse 70, temperature 97.9 F (36.6 C), temperature source Oral, resp. rate 16, height  (1.854 m), weight 90.3 kg (199 lb), SpO2 97 %.   Wt Readings from  Last 1 Encounters:  07/03/16 90.3 kg (199 lb)     General appearance: alert and cooperative Resp: clear to auscultation bilaterally Cardio: regular rate and rhythm GI: soft, non-tender; bowel sounds normal; no masses,  no organomegaly Pulses: 2+ and symmetric Neurologic: Grossly normal  TELEMETRY: Reviewed telemetry pt in nsr  ASSESSMENT AND PLAN:  Active Problems:   Non-ST elevated myocardial infarction (HCC)-ruled in for nstemi. Cardiac cath reveal patent stent in rca and lad. No discreet lesions. Etiology of event is likely spontaneous return of flow as there are no lesions greater than 50-60%. Will treat medically for now with asa 81 mg, plavix 75 mg, imdur 30 mg. Will need to try reducing lisinopril to 10 mg daily to allow use of low dose beta blockers. Will try carvedilol at 3.125 mg bid and follow hemodynamics. Statin intolerant so will continue with fenofibrate. Phase 2 cardiac rehab ordered.  OK for discharge today with follow up with Dr. Gwen Pounds in 1 week.     Juan Webster  Lady Gary, MD, [facility] 07/04/2016 8:08 AM

## 2016-07-04 NOTE — Progress Notes (Signed)
Patient given discharge teaching and paperwork regarding medications, diet, follow-up appointments and activity. Patient understanding verbalized. No complaints at this time. IV and telemetry discontinued prior to leaving. Skin assessment as previously charted and vitals are stable; on room air. Patient being discharged to home. Caregiver/family present during discharge teaching. No further needs by Care Management.  

## 2016-07-06 NOTE — Discharge Summary (Signed)
[employer] - [place] at [facility]   PATIENT NAME: Juan Webster    MR#:  [id number]  DATE OF BIRTH:  1935/03/26  DATE OF ADMISSION:  07/02/2016 ADMITTING PHYSICIAN: Auburn Bilberry, MD  DATE OF DISCHARGE: 07/04/2016 11:28 AM  PRIMARY CARE PHYSICIAN: Curtis Sites III, MD   ADMISSION DIAGNOSIS:  NSTEMI (non-ST elevated myocardial infarction) (HCC) [I21.4]  DISCHARGE DIAGNOSIS:  Active Problems:   Non-ST elevated myocardial infarction [facility])   SECONDARY DIAGNOSIS:   Past Medical History:  Diagnosis Date  . Arthritis   . Chronic kidney disease    CKD -stage 3  . Diabetes mellitus without complication (HCC)   . Diverticulosis   . Dysrhythmia    controlled with pacemaker  . Gout   . Hard of hearing   . History of hiatal hernia   . Hypertension   . IBS (irritable bowel syndrome)   . 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 Presence of permanent cardiac pacemaker 10/17/00, 07/31/07   Medtronic Adapta DR  ADDRL1  . Wears dentures    full upper, partial lower     ADMITTING HISTORY  HISTORY OF PRESENT ILLNESS: Juan Webster  is a 81 y.o. male with a known history of CAD with stent,Hypertension, diabetes type 2 was presenting with a complaint of ongoing chest pain for the past few weeks. He was seen by his primary care provider as well. Thought to have possible GI related pain. Patient earlier today started having substernal pressure-like sensation in the chest as well as upper epigastric region. It lasted for a few hours. He also had pain radiating to the left arm. Patient came to the emergency room with the symptoms was noted to have abnormal troponin of 0.16. Emergency room physician spoke with Dr. Ihor Austin of cardiology who recommended patient be started on heparin and will see the patient.  HOSPITAL COURSE:   * Non-ST elevation MI Patient was treated with aspirin, Plavix, statin, beta blocker. Cardiac catheterization showed no change from prior catheterizations. No  occlusions that are amenable to intervention. His MI was thought to be likely due to small vessels. Medical management advised. Discussed with Dr. Lady Gary of cardiology. Added Imdur. His elevated troponin could also be due to on and off arrhythmias. But no arrhythmias found in telemetry. Will follow-up with cardiology in 1 week. By the day of discharge he has ambulated well without any problems. No further chest pain.  Stable for discharge home.  CONSULTS OBTAINED:  Treatment Team:  Dalia Heading, MD  DRUG ALLERGIES:   Allergies  Allergen Reactions  . Simvastatin     Muscle weakness    DISCHARGE MEDICATIONS:   Discharge Medication List as of 07/04/2016 10:55 AM    START taking these medications   Details  isosorbide mononitrate (IMDUR) 30 MG 24 hr tablet Take 1 tablet (30 mg total) by mouth daily., Starting Tue 07/04/2016, Normal      CONTINUE these medications which have CHANGED   Details  lisinopril (PRINIVIL,ZESTRIL) 20 MG tablet Take 0.5 tablets (10 mg total) by mouth daily., Starting Tue 07/04/2016, No Print      CONTINUE these medications which have NOT CHANGED   Details  acetaminophen (TYLENOL) 500 MG tablet Take 500 mg by mouth every 6 (six) hours as needed., Historical Med    allopurinol (ZYLOPRIM) 100 MG tablet Take 100 mg by mouth daily., Historical Med    aspirin 81 MG tablet Take 81 mg by mouth daily., Historical  Med    carvedilol (COREG) 3.125 MG tablet Take 3.125 mg by mouth 2 (two) times daily with a meal., Historical Med    celecoxib (CELEBREX) 200 MG capsule Take 200 mg by mouth daily., Historical Med    Cinnamon 500 MG TABS Take by mouth 2 (two) times daily., Historical Med    fenofibrate (TRICOR) 145 MG tablet Take 145 mg by mouth daily., Historical Med    folic acid (FOLVITE) 1 MG tablet Take 1 mg by mouth daily., Historical Med    glimepiride (AMARYL) 4 MG tablet Take 4 mg by mouth 2 (two) times daily., Historical Med    hydrochlorothiazide  (HYDRODIURIL) 25 MG tablet Take 25 mg by mouth daily., Historical Med    metFORMIN (GLUCOPHAGE) 1000 MG tablet Take 1,000 mg by mouth 2 (two) times daily with a meal. , Historical Med    Omega-3 Fatty Acids (OMEGA-3 FISH OIL PO) Take by mouth 2 (two) times daily., Historical Med    sitaGLIPtin (JANUVIA) 100 MG tablet Take 100 mg by mouth daily., Historical Med    traMADol (ULTRAM) 50 MG tablet Take 50 mg by mouth every 4 (four) hours as needed. , Historical Med      STOP taking these medications     saxagliptin HCl (ONGLYZA) 5 MG TABS tablet      cyclobenzaprine (FLEXERIL) 10 MG tablet         Today   VITAL SIGNS:  Blood pressure 140/67, pulse 77, temperature 97.9 F (36.6 C), temperature source Oral, resp. rate 16, height  (1.854 m), weight 90.3 kg (199 lb), SpO2 97 %.  I/O:  No intake or output data in the 24 hours ending 07/06/16 1327  PHYSICAL EXAMINATION:  Physical Exam  GENERAL:  81 y.o.-year-old patient lying in the bed with no acute distress.  LUNGS: Normal breath sounds bilaterally, no wheezing, rales,rhonchi or crepitation. No use of accessory muscles of respiration.  CARDIOVASCULAR: S1, S2 normal. No murmurs, rubs, or gallops.  ABDOMEN: Soft, non-tender, non-distended. Bowel sounds present. No organomegaly or mass.  NEUROLOGIC: Moves all 4 extremities. PSYCHIATRIC: The patient is alert and oriented x 3.  SKIN: No obvious rash, lesion, or ulcer.   DATA REVIEW:   CBC  Recent Labs Lab 07/04/16 0618  WBC 8.1  HGB 14.1  HCT 41.7  PLT 131*    Chemistries   Recent Labs Lab 07/02/16 1134 07/03/16 0445  NA 136 138  K 4.5 4.4  CL 104 106  CO2 23 26  GLUCOSE 246* 115*  BUN 36* 32*  CREATININE 1.49* 1.39*  CALCIUM 8.8* 9.0  AST 31  --   ALT 14*  --   ALKPHOS 57  --   BILITOT 0.8  --     Cardiac Enzymes  Recent Labs Lab 07/03/16 0335  TROPONINI 10.61*    Microbiology Results  No results found for this or any previous  visit.  RADIOLOGY:  No results found.  Follow up with PCP in 1 week.  Management plans discussed with the patient, family and they are in agreement.  CODE STATUS:  Code Status History    Date Active Date Inactive Code Status Order ID Comments User Context   07/02/2016  3:46 PM 07/04/2016  2:34 PM Full Code [id number]  Auburn Bilberry, MD Inpatient    Advance Directive Documentation     Most Recent Value  Type of Advance Directive  Healthcare Power of Attorney, Living will  Pre-existing out of facility DNR order (yellow form or  pink MOST form)  -  "MOST" Form in Place?  -      TOTAL TIME TAKING CARE OF THIS PATIENT ON DAY OF DISCHARGE: more than 30 minutes.   Milagros Loll R M.D on 07/06/2016 at 1:27 PM  Between 7am to 6pm - Pager - [phone]  After 6pm go to [email or web address] - password [email or web address] [facility]  [facility]  Office  [phone]  CC: Primary care physician; Lynnea Ferrier, MD  Note: This dictation was prepared with Dragon dictation along with smaller phrase technology. Any transcriptional errors that result from this process are unintentional.

## 2017-03-13 DIAGNOSIS — L0390 Cellulitis, unspecified: Secondary | ICD-10-CM

## 2017-03-13 DIAGNOSIS — B9562 Methicillin resistant Staphylococcus aureus infection as the cause of diseases classified elsewhere: Secondary | ICD-10-CM

## 2017-03-13 HISTORY — DX: Methicillin resistant Staphylococcus aureus infection as the cause of diseases classified elsewhere: B95.62

## 2017-03-13 HISTORY — DX: Cellulitis, unspecified: L03.90

## 2017-09-10 ENCOUNTER — Other Ambulatory Visit: Payer: Self-pay | Admitting: Neurology

## 2017-09-10 DIAGNOSIS — G3184 Mild cognitive impairment, so stated: Secondary | ICD-10-CM

## 2017-09-27 ENCOUNTER — Ambulatory Visit
Admission: RE | Admit: 2017-09-27 | Discharge: 2017-09-27 | Disposition: A | Discharge status: Home / Self Care | Payer: Medicare HMO | Source: Ambulatory Visit | Attending: Neurology | Admitting: Neurology

## 2017-09-27 DIAGNOSIS — E538 Deficiency of other specified B group vitamins: Secondary | ICD-10-CM | POA: Insufficient documentation

## 2017-09-27 DIAGNOSIS — G3184 Mild cognitive impairment, so stated: Secondary | ICD-10-CM | POA: Insufficient documentation

## 2017-09-27 DIAGNOSIS — R413 Other amnesia: Secondary | ICD-10-CM | POA: Diagnosis present

## 2018-03-01 ENCOUNTER — Other Ambulatory Visit: Payer: Self-pay

## 2018-03-01 ENCOUNTER — Encounter: Payer: Self-pay | Admitting: *Deleted

## 2018-03-01 ENCOUNTER — Observation Stay
Admission: EM | Admit: 2018-03-01 | Discharge: 2018-03-02 | Disposition: A | Discharge status: Home / Self Care | Payer: Medicare HMO | Attending: Family Medicine | Admitting: Family Medicine

## 2018-03-01 DIAGNOSIS — E1122 Type 2 diabetes mellitus with diabetic chronic kidney disease: Secondary | ICD-10-CM | POA: Insufficient documentation

## 2018-03-01 DIAGNOSIS — I129 Hypertensive chronic kidney disease with stage 1 through stage 4 chronic kidney disease, or unspecified chronic kidney disease: Secondary | ICD-10-CM | POA: Insufficient documentation

## 2018-03-01 DIAGNOSIS — I2510 Atherosclerotic heart disease of native coronary artery without angina pectoris: Secondary | ICD-10-CM | POA: Diagnosis not present

## 2018-03-01 DIAGNOSIS — R001 Bradycardia, unspecified: Secondary | ICD-10-CM | POA: Diagnosis not present

## 2018-03-01 DIAGNOSIS — Z7982 Long term (current) use of aspirin: Secondary | ICD-10-CM | POA: Diagnosis not present

## 2018-03-01 DIAGNOSIS — N183 Chronic kidney disease, stage 3 (moderate): Secondary | ICD-10-CM | POA: Diagnosis not present

## 2018-03-01 DIAGNOSIS — Z79891 Long term (current) use of opiate analgesic: Secondary | ICD-10-CM | POA: Diagnosis not present

## 2018-03-01 DIAGNOSIS — E1140 Type 2 diabetes mellitus with diabetic neuropathy, unspecified: Secondary | ICD-10-CM | POA: Insufficient documentation

## 2018-03-01 DIAGNOSIS — E785 Hyperlipidemia, unspecified: Secondary | ICD-10-CM | POA: Insufficient documentation

## 2018-03-01 DIAGNOSIS — Z950 Presence of cardiac pacemaker: Secondary | ICD-10-CM | POA: Insufficient documentation

## 2018-03-01 DIAGNOSIS — Z79899 Other long term (current) drug therapy: Secondary | ICD-10-CM | POA: Insufficient documentation

## 2018-03-01 DIAGNOSIS — Z7902 Long term (current) use of antithrombotics/antiplatelets: Secondary | ICD-10-CM | POA: Diagnosis not present

## 2018-03-01 DIAGNOSIS — R531 Weakness: Secondary | ICD-10-CM | POA: Diagnosis present

## 2018-03-01 LAB — CBC WITH DIFFERENTIAL/PLATELET
Abs Immature Granulocytes: 0.04 10*3/uL (ref 0.00–0.07)
Basophils Absolute: 0.1 10*3/uL (ref 0.0–0.1)
Basophils Relative: 1 %
Eosinophils Absolute: 0.2 10*3/uL (ref 0.0–0.5)
Eosinophils Relative: 2 %
HEMATOCRIT: 42 % (ref 39.0–52.0)
HEMOGLOBIN: 14 g/dL (ref 13.0–17.0)
Immature Granulocytes: 1 %
LYMPHS ABS: 1.8 10*3/uL (ref 0.7–4.0)
LYMPHS PCT: 20 %
MCH: 28.9 pg (ref 26.0–34.0)
MCHC: 33.3 g/dL (ref 30.0–36.0)
MCV: 86.8 fL (ref 80.0–100.0)
MONO ABS: 0.8 10*3/uL (ref 0.1–1.0)
Monocytes Relative: 10 %
Neutro Abs: 5.8 10*3/uL (ref 1.7–7.7)
Neutrophils Relative %: 66 %
Platelets: 172 10*3/uL (ref 150–400)
RBC: 4.84 MIL/uL (ref 4.22–5.81)
RDW: 13.5 % (ref 11.5–15.5)
WBC: 8.8 10*3/uL (ref 4.0–10.5)
nRBC: 0 % (ref 0.0–0.2)

## 2018-03-01 LAB — COMPREHENSIVE METABOLIC PANEL
ALBUMIN: 4.3 g/dL (ref 3.5–5.0)
ALK PHOS: 53 U/L (ref 38–126)
ALT: 13 U/L (ref 0–44)
AST: 21 U/L (ref 15–41)
Anion gap: 10 (ref 5–15)
BILIRUBIN TOTAL: 0.7 mg/dL (ref 0.3–1.2)
BUN: 34 mg/dL — ABNORMAL HIGH (ref 8–23)
CALCIUM: 9.3 mg/dL (ref 8.9–10.3)
CO2: 23 mmol/L (ref 22–32)
CREATININE: 1.4 mg/dL — AB (ref 0.61–1.24)
Chloride: 107 mmol/L (ref 98–111)
GFR calc non Af Amer: 47 mL/min — ABNORMAL LOW (ref >60)
GFR, EST AFRICAN AMERICAN: 55 mL/min — AB (ref >60)
GLUCOSE: 93 mg/dL (ref 70–99)
Potassium: 4.1 mmol/L (ref 3.5–5.1)
Sodium: 140 mmol/L (ref 135–145)
TOTAL PROTEIN: 7.2 g/dL (ref 6.5–8.1)

## 2018-03-01 LAB — TROPONIN I: Troponin I: <0.03 ng/mL (ref <0.03)

## 2018-03-01 NOTE — ED Triage Notes (Signed)
Pt reports feeling weakness today and checking pulse at drug store. Pulse reading 38 at store. Radial pulse of 39 in triage. No chest pain or SOB. No syncope. Pt has a pacemaker.

## 2018-03-01 NOTE — H&P (Signed)
Juan Webster is an 82 y.o. male.   Chief Complaint: Weakness HPI: The patient with past medical history of diabetes, chronic kidney disease, hypertension and arrhythmia status post pacemaker placement presents to the emergency department complaining of weakness.  He is felt weak all day and checked his pulse which was approximately 40.  He denies chest pain or shortness of breath, nausea or vomiting.  The patient's pacemaker was interrogated in the emergency department and found to have had multiple episodes of V. tach.  Cardiology was called who recommended the patient be placed under observation for further evaluation of pacemaker sensing capability and/or dangerous arrhythmia.  Past Medical History:  Diagnosis Date  . Arthritis   . Chronic kidney disease    CKD -stage 3  . Diabetes mellitus without complication (HCC)   . Diverticulosis   . Dysrhythmia    controlled with pacemaker  . Gout   . Hard of hearing   . History of hiatal hernia   . Hypertension   . IBS (irritable bowel syndrome)   . 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 Presence of permanent cardiac pacemaker 10/17/00, 07/31/07   Medtronic Adapta DR  ADDRL1  . Wears dentures    full upper, partial lower    Past Surgical History:  Procedure Laterality Date  . CATARACT EXTRACTION W/PHACO Left 06/30/2015   Procedure: CATARACT EXTRACTION PHACO AND INTRAOCULAR LENS PLACEMENT (IOC);  Surgeon: Lockie Molahadwick Brasington, MD;  [facility];  Service: Ophthalmology;  Laterality: Left;  DIABETIC - oral meds  . CATARACT EXTRACTION W/PHACO Right 07/28/2015   Procedure: CATARACT EXTRACTION PHACO AND INTRAOCULAR LENS PLACEMENT (IOC) right eye;  Surgeon: Lockie Molahadwick Brasington, MD;  [facility];  Service: Ophthalmology;  Laterality: Right;  DIABETIC - oral meds TORIC  . COLONOSCOPY    . CORONARY ANGIOPLASTY  09/08   stent - RDA  . INSERT / REPLACE / REMOVE PACEMAKER  10/17/00, 07/31/07  . LEFT HEART CATH  AND CORONARY ANGIOGRAPHY N/A 07/03/2016   Procedure: Left Heart Cath and Coronary Angiography;  Surgeon: Dalia HeadingKenneth A Fath, MD;  Location: [facility] INVASIVE CV LAB;  Service: Cardiovascular;  Laterality: N/A;  . NECK SURGERY    . SHOULDER SURGERY     x3  . TONSILLECTOMY      History reviewed. No pertinent family history. Social History:  reports that he has never smoked. He has never used smokeless tobacco. He reports that he does not drink alcohol. No history on file for drug.  Allergies:  Allergies  Allergen Reactions  . Simvastatin     Muscle weakness    Prior to Admission medications   Medication Sig Start Date End Date Taking? Authorizing Provider  acetaminophen (TYLENOL) 500 MG tablet Take 500 mg by mouth every 6 (six) hours as needed.   Yes [provider]  allopurinol (ZYLOPRIM) 100 MG tablet Take 100 mg by mouth daily.   Yes [provider]  aspirin 81 MG tablet Take 81 mg by mouth daily.   Yes [provider]  carvedilol (COREG) 3.125 MG tablet Take 3.125 mg by mouth 2 (two) times daily with a meal.   Yes [provider]  Cinnamon 500 MG TABS Take 1,000 mg by mouth 2 (two) times daily.    Yes [provider]  clopidogrel (PLAVIX) 75 MG tablet Take 75 mg by mouth daily. 08/27/17 08/27/18 Yes [provider]  donepezil (ARICEPT) 5 MG tablet Take 5 mg by mouth daily.  09/10/17 09/10/18 Yes [provider]  fenofibrate (TRICOR) 145 MG tablet Take 145 mg by mouth daily.   Yes [provider]  folic acid (FOLVITE) 1 MG tablet Take 1 mg by mouth daily.   Yes [provider]  glimepiride (AMARYL) 4 MG tablet Take 4 mg by mouth 2 (two) times daily.   Yes [provider]  hydrochlorothiazide (HYDRODIURIL) 25 MG tablet Take 25 mg by mouth daily.   Yes [provider]  isosorbide mononitrate (IMDUR) 30 MG 24 hr tablet Take 1 tablet (30 mg total) by mouth daily. 07/04/16  Yes Sudini, Wardell HeathSrikar, MD  metFORMIN  (GLUCOPHAGE) 1000 MG tablet Take 1,000-2,000 mg by mouth 2 (two) times daily with a meal. Take 1 tablet in the morning and 1.5 tablet in the evening   Yes [provider]  Omega-3 Fatty Acids (OMEGA-3 FISH OIL PO) Take by mouth 2 (two) times daily.   Yes [provider]  pantoprazole (PROTONIX) 40 MG tablet Take 40 mg by mouth daily. 09/11/17  Yes [provider]  sitaGLIPtin (JANUVIA) 100 MG tablet Take 100 mg by mouth daily.   Yes [provider]  traMADol (ULTRAM) 50 MG tablet Take 50 mg by mouth every 4 (four) hours as needed.    Yes [provider]  celecoxib (CELEBREX) 200 MG capsule Take 200 mg by mouth daily.    [provider]  lisinopril (PRINIVIL,ZESTRIL) 20 MG tablet Take 0.5 tablets (10 mg total) by mouth daily. Patient not taking: Reported on 03/01/2018 07/04/16   Milagros LollSudini, Srikar, MD     Results for orders placed or performed during the hospital encounter of 03/01/18 (from the past 48 hour(s))  CBC with Differential     Status: None   Collection Time: 03/01/18  3:52 PM  Result Value Ref Range   WBC 8.8 4.0 - 10.5 K/uL   RBC 4.84 4.22 - 5.81 MIL/uL   Hemoglobin 14.0 13.0 - 17.0 g/dL   HCT 29.542.0 62.139.0 - 30.852.0 %   MCV 86.8 80.0 - 100.0 fL   MCH 28.9 26.0 - 34.0 pg   MCHC 33.3 30.0 - 36.0 g/dL   RDW 65.713.5 84.611.5 - 96.215.5 %   Platelets 172 150 - 400 K/uL   nRBC 0.0 0.0 - 0.2 %   Neutrophils Relative % 66 %   Neutro Abs 5.8 1.7 - 7.7 K/uL   Lymphocytes Relative 20 %   Lymphs Abs 1.8 0.7 - 4.0 K/uL   Monocytes Relative 10 %   Monocytes Absolute 0.8 0.1 - 1.0 K/uL   Eosinophils Relative 2 %   Eosinophils Absolute 0.2 0.0 - 0.5 K/uL   Basophils Relative 1 %   Basophils Absolute 0.1 0.0 - 0.1 K/uL   Immature Granulocytes 1 %   Abs Immature Granulocytes 0.04 0.00 - 0.07 K/uL    Comment: Performed at [facility], [address] [place], [place] [address]  Comprehensive metabolic panel     Status: Abnormal   Collection  Time: 03/01/18  3:52 PM  Result Value Ref Range   Sodium 140 135 - 145 mmol/L   Potassium 4.1 3.5 - 5.1 mmol/L   Chloride 107 98 - 111 mmol/L   CO2 23 22 - 32 mmol/L   Glucose, Bld 93 70 - 99 mg/dL   BUN 34 (H) 8 - 23 mg/dL   Creatinine, Ser 1.321.40 (H) 0.61 - 1.24 mg/dL   Calcium 9.3 8.9 - 44.010.3 mg/dL   Total Protein 7.2 6.5 -  8.1 g/dL   Albumin 4.3 3.5 - 5.0 g/dL   AST 21 15 - 41 U/L   ALT 13 0 - 44 U/L   Alkaline Phosphatase 53 38 - 126 U/L   Total Bilirubin 0.7 0.3 - 1.2 mg/dL   GFR calc non Af Amer 47 (L) >60 mL/min   GFR calc Af Amer 55 (L) >60 mL/min   Anion gap 10 5 - 15    Comment: Performed at [facility], [address] [place], [place] [address]  Troponin I - ONCE - STAT     Status: None   Collection Time: 03/01/18  3:52 PM  Result Value Ref Range   Troponin I <0.03 <0.03 ng/mL    Comment: Performed at [facility], [address] [place], [place] [address]   No results found.  Review of Systems  Constitutional: Negative for chills and fever.  HENT: Negative for sore throat and tinnitus.   Eyes: Negative for blurred vision and redness.  Respiratory: Negative for cough and shortness of breath.   Cardiovascular: Negative for chest pain, palpitations, orthopnea and PND.  Gastrointestinal: Negative for abdominal pain, diarrhea, nausea and vomiting.  Genitourinary: Negative for dysuria, frequency and urgency.  Musculoskeletal: Negative for joint pain and myalgias.  Skin: Negative for rash.       No lesions  Neurological: Positive for weakness. Negative for speech change and focal weakness.  Endo/Heme/Allergies: Does not bruise/bleed easily.       No temperature intolerance  Psychiatric/Behavioral: Negative for depression and suicidal ideas.    Blood pressure (!) 173/52, pulse 67, temperature 98 F (36.7 C), temperature source Oral, resp. rate 20, weight 90.3 kg, SpO2 96 %. Physical Exam  Vitals reviewed. Constitutional: He is oriented to  person, place, and time. He appears well-developed and well-nourished. No distress.  HENT:  Head: Normocephalic and atraumatic.  Mouth/Throat: Oropharynx is clear and moist.  Eyes: Pupils are equal, round, and reactive to light. Conjunctivae and EOM are normal.  Neck: Normal range of motion. Neck supple. No JVD present. No tracheal deviation present. No thyromegaly present.  Cardiovascular: Normal rate, regular rhythm and normal heart sounds. Exam reveals no gallop and no friction rub.  No murmur heard. Respiratory: Effort normal and breath sounds normal. No respiratory distress.  GI: Soft. Bowel sounds are normal. He exhibits no distension. There is no abdominal tenderness.  Genitourinary:    Genitourinary Comments: Deferred   Musculoskeletal: Normal range of motion.        General: No edema.  Lymphadenopathy:    He has no cervical adenopathy.  Neurological: He is alert and oriented to person, place, and time. No cranial nerve deficit. Coordination normal.  Skin: Skin is warm and dry. No rash noted. No erythema.  Psychiatric: He has a normal mood and affect. His behavior is normal. Judgment and thought content normal.     Assessment/Plan This is an 82 year old male admitted for bradycardia. 1.  Bradycardia: Unclear if underlying rhythm or is secondary to medications.  Pacemaker is not kicking in as indicated.  Also, pacemaker is recorded episodes of ventricular tachycardia.  Cardiology to consult. 2.  CAD: Stable; continue aspirin and Plavix.  I have held celecoxib.  Continue Imdur 3.  Hypertension: Controlled; continue carvedilol, lisinopril and hydrochlorothiazide 4.  Diabetes mellitus type 2: Hold oral hypoglycemic agents.  Sliding scale insulin while hospitalized 5.  Dyslipidemia: Continue fenofibrate 6.  DVT prophylaxis: Lovenox 7.  GI prophylaxis: Pantoprazole per home regimen The patient is a  full code.  Time spent on admission orders and patient care approximately 45  minutes  Arnaldo Natal, MD 03/01/2018, 10:06 PM

## 2018-03-01 NOTE — ED Provider Notes (Signed)
[facility] Emergency Department Provider Note  ____________________________________________   First MD Initiated Contact with Patient 03/01/18 1544     (approximate)  I have reviewed the triage vital signs and the nursing notes.   HISTORY  Chief Complaint Weakness   HPI Juan Webster is a 82 y.o. male with history of chronic kidney disease, hypertension, MI as well as a pacemaker who is presenting to the emergency department today with a pulse of 40.  He says that he was feeling generally weak as well as slightly lightheaded today and took his pulse and found it to be 40.  He denies any pain.  Denies any shortness of breath.  Came to the emergency department for further evaluation.  Says that he has a patient of Dr. Philemon KingdomKowalski's and also sees Dr. Graciela HusbandsKlein for electrophysiology.   Past Medical History:  Diagnosis Date  . Arthritis   . Chronic kidney disease    CKD -stage 3  . Diabetes mellitus without complication (HCC)   . Diverticulosis   . Dysrhythmia    controlled with pacemaker  . Gout   . Hard of hearing   . History of hiatal hernia   . Hypertension   . IBS (irritable bowel syndrome)   . 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 Presence of permanent cardiac pacemaker 10/17/00, 07/31/07   Medtronic Adapta DR  ADDRL1  . Wears dentures    full upper, partial lower    Patient Active Problem List   Diagnosis Date Noted  . Non-ST elevated myocardial infarction [facility](HCC) 07/02/2016    Past Surgical History:  Procedure Laterality Date  . CATARACT EXTRACTION W/PHACO Left 06/30/2015   Procedure: CATARACT EXTRACTION PHACO AND INTRAOCULAR LENS PLACEMENT (IOC);  Surgeon: Lockie Molahadwick Brasington, MD;  [facility];  Service: Ophthalmology;  Laterality: Left;  DIABETIC - oral meds  . CATARACT EXTRACTION W/PHACO Right 07/28/2015   Procedure: CATARACT EXTRACTION PHACO AND INTRAOCULAR LENS PLACEMENT (IOC) right eye;  Surgeon: Lockie Molahadwick Brasington,  MD;  [facility];  Service: Ophthalmology;  Laterality: Right;  DIABETIC - oral meds TORIC  . COLONOSCOPY    . CORONARY ANGIOPLASTY  09/08   stent - RDA  . INSERT / REPLACE / REMOVE PACEMAKER  10/17/00, 07/31/07  . LEFT HEART CATH AND CORONARY ANGIOGRAPHY N/A 07/03/2016   Procedure: Left Heart Cath and Coronary Angiography;  Surgeon: Dalia HeadingKenneth A Fath, MD;  Location: [facility] INVASIVE CV LAB;  Service: Cardiovascular;  Laterality: N/A;  . NECK SURGERY    . SHOULDER SURGERY     x3  . TONSILLECTOMY      Prior to Admission medications   Medication Sig Start Date End Date Taking? Authorizing Provider  acetaminophen (TYLENOL) 500 MG tablet Take 500 mg by mouth every 6 (six) hours as needed.   Yes [provider]  allopurinol (ZYLOPRIM) 100 MG tablet Take 100 mg by mouth daily.   Yes [provider]  aspirin 81 MG tablet Take 81 mg by mouth daily.   Yes [provider]  carvedilol (COREG) 3.125 MG tablet Take 3.125 mg by mouth 2 (two) times daily with a meal.   Yes [provider]  Cinnamon 500 MG TABS Take 1,000 mg by mouth 2 (two) times daily.    Yes [provider]  clopidogrel (PLAVIX) 75 MG tablet Take 75 mg by mouth daily. 08/27/17 08/27/18 Yes [provider]  donepezil (ARICEPT) 5 MG tablet Take 5 mg by  mouth daily. 09/10/17 09/10/18 Yes [provider]  fenofibrate (TRICOR) 145 MG tablet Take 145 mg by mouth daily.   Yes [provider]  folic acid (FOLVITE) 1 MG tablet Take 1 mg by mouth daily.   Yes [provider]  glimepiride (AMARYL) 4 MG tablet Take 4 mg by mouth 2 (two) times daily.   Yes [provider]  hydrochlorothiazide (HYDRODIURIL) 25 MG tablet Take 25 mg by mouth daily.   Yes [provider]  isosorbide mononitrate (IMDUR) 30 MG 24 hr tablet Take 1 tablet (30 mg total) by mouth daily. 07/04/16  Yes Sudini, Wardell HeathSrikar, MD  metFORMIN (GLUCOPHAGE) 1000 MG tablet Take  1,000-2,000 mg by mouth 2 (two) times daily with a meal. Take 1 tablet in the morning and 1.5 tablet in the evening   Yes [provider]  Omega-3 Fatty Acids (OMEGA-3 FISH OIL PO) Take by mouth 2 (two) times daily.   Yes [provider]  pantoprazole (PROTONIX) 40 MG tablet Take 40 mg by mouth daily. 09/11/17  Yes [provider]  sitaGLIPtin (JANUVIA) 100 MG tablet Take 100 mg by mouth daily.   Yes [provider]  traMADol (ULTRAM) 50 MG tablet Take 50 mg by mouth every 4 (four) hours as needed.    Yes [provider]  celecoxib (CELEBREX) 200 MG capsule Take 200 mg by mouth daily.    [provider]  lisinopril (PRINIVIL,ZESTRIL) 20 MG tablet Take 0.5 tablets (10 mg total) by mouth daily. Patient not taking: Reported on 03/01/2018 07/04/16   Milagros LollSudini, Srikar, MD    Allergies Simvastatin  History reviewed. No pertinent family history.  Social History Social History   Tobacco Use  . Smoking status: Never Smoker  . Smokeless tobacco: Never Used  Substance Use Topics  . Alcohol use: No  . Drug use: Not on file    Review of Systems  Constitutional: No fever/chills Eyes: No visual changes. ENT: No sore throat. Cardiovascular: Denies chest pain. Respiratory: Denies shortness of breath. Gastrointestinal: No abdominal pain.  No nausea, no vomiting.  No diarrhea.  No constipation. Genitourinary: Negative for dysuria. Musculoskeletal: Negative for back pain. Skin: Negative for rash. Neurological: Negative for headaches, focal weakness or numbness.   ____________________________________________   PHYSICAL EXAM:  VITAL SIGNS: ED Triage Vitals  Enc Vitals Group     BP 03/01/18 1506 (!) 151/52     Pulse Rate 03/01/18 1506 (!) 39     Resp 03/01/18 1506 16     Temp 03/01/18 1506 98 F (36.7 C)     Temp Source 03/01/18 1506 Oral     SpO2 03/01/18 1506 97 %     Weight 03/01/18 1507 199 lb 1.2 oz (90.3 kg)     Height --       Head Circumference --      Peak Flow --      Pain Score 03/01/18 1507 0     Pain Loc --      Pain Edu? --      Excl. in GC? --     Constitutional: Alert and oriented. Well appearing and in no acute distress. Eyes: Conjunctivae are normal.  Head: Atraumatic. Nose: No congestion/rhinnorhea. Mouth/Throat: Mucous membranes are moist.  Neck: No stridor.   Cardiovascular: Bradycardic pulses with ventricular bigeminy seen on the monitor. Grossly normal heart sounds.  Good peripheral circulation with equal and bilateral radial pulses. Respiratory: Normal respiratory effort.  No retractions. Lungs CTAB. Gastrointestinal: Soft and nontender. No  distention.  Musculoskeletal: No lower extremity tenderness nor edema.  No joint effusions. Neurologic:  Normal speech and language. No gross focal neurologic deficits are appreciated. Skin:  Skin is warm, dry and intact. No rash noted. Psychiatric: Mood and affect are normal. Speech and behavior are normal.  ____________________________________________   LABS (all labs ordered are listed, but only abnormal results are displayed)  Labs Reviewed  COMPREHENSIVE METABOLIC PANEL - Abnormal; Notable for the following components:      Result Value   BUN 34 (*)    Creatinine, Ser 1.40 (*)    GFR calc non Af Amer 47 (*)    GFR calc Af Amer 55 (*)    All other components within normal limits  CBC WITH DIFFERENTIAL/PLATELET  TROPONIN I   ____________________________________________  EKG  ED ECG REPORT I, Arelia Longest, the attending physician, personally viewed and interpreted this ECG.   Date: 03/01/2018  EKG Time: 1458  Rate: 82  Rhythm: Paced rhythm with ventricular bigeminy  Axis: Normal axis for paced rhythm  Intervals:Wide-complex secondary to paced rhythm  ST&T Change: ST elevations consistent with ventricular pacing.  ST depressions consistent with paced ventricular beats.  ED ECG REPORT I, Arelia Longest, the attending  physician, personally viewed and interpreted this ECG.   Date: 03/01/2018  EKG Time: 1624  Rate: 126  Rhythm: Paced rhythm  Axis: Normal  Intervals:Wide-complex  ST&T Change: ST elevations or depression consistent with paced rhythm and unchanged from previous.  ED ECG REPORT I, Arelia Longest, the attending physician, personally viewed and interpreted this ECG.   Date: 03/01/2018  EKG Time: 1625  Rate: 91  Rhythm: Paced rhythm  Axis: Normal  Intervals:Wide-complex secondary to ventricular pacing  ST&T Change: ST elevations and depressions are unchanged and consistent with paced rhythm.  ____________________________________________  RADIOLOGY   ____________________________________________   PROCEDURES  Procedure(s) performed:   Procedures  Critical Care performed:   ____________________________________________   INITIAL IMPRESSION / ASSESSMENT AND PLAN / ED COURSE  Pertinent labs & imaging results that were available during my care of the patient were reviewed by me and considered in my medical decision making (see chart for details).  DDX: Ventricular bigeminy, pacemaker malfunction, electrolyte abnormality, MI As part of my medical decision making, I reviewed the following data within the electronic MEDICAL RECORD NUMBERNotes reviewed from prior cardiology visits.  ----------------------------------------- 6:03 PM on 03/01/2018 -----------------------------------------  I had several discussions with Dr. Gwen Pounds.  He initially suggested that we work the patient's arms as well as have the patient walk to see if this would affect the sensing of the pacemaker and normalize the electrical patterns.  However, the patient continued to have bigeminy.  Also continued with a pulse of 40.  We are now interrogating the patient's pacemaker.  Plan on continuing to discuss further with Dr. Gwen Pounds we have these results.  ----------------------------------------- 7:35 PM on  03/01/2018 -----------------------------------------  [employer] interrogation reveals that the patient has not had his pacemaker reset since 2010.  Has had multiple sustained episodes of V. tach.  Discussed this with Dr. Gwen Pounds who would like the patient observed in the hospital.  Signed out to Dr. Sheryle Hail.  Patient and family aware of need for admission and willing to comply.  ____________________________________________   FINAL CLINICAL IMPRESSION(S) / ED DIAGNOSES  Ventricular bigeminy.  Bradycardia.  NEW MEDICATIONS STARTED DURING THIS VISIT:  New Prescriptions   No medications on file     Note:  This document was prepared using  Dragon Chemical engineer and may include unintentional dictation errors.     Myrna Blazer, MD 03/01/18 Barry Brunner

## 2018-03-02 LAB — GLUCOSE, CAPILLARY
Glucose-Capillary: 126 mg/dL — ABNORMAL HIGH (ref 70–99)
Glucose-Capillary: 135 mg/dL — ABNORMAL HIGH (ref 70–99)
Glucose-Capillary: 169 mg/dL — ABNORMAL HIGH (ref 70–99)

## 2018-03-02 LAB — HEMOGLOBIN A1C
Hgb A1c MFr Bld: 6.5 % — ABNORMAL HIGH (ref 4.8–5.6)
Mean Plasma Glucose: 139.85 mg/dL

## 2018-03-02 LAB — TSH: TSH: 1.416 u[IU]/mL (ref 0.350–4.500)

## 2018-03-02 MED ORDER — ACETAMINOPHEN 325 MG PO TABS: 650.0000 mg | ORAL_TABLET | Freq: Four times a day (QID) | ORAL | Status: DC | PRN

## 2018-03-02 MED ORDER — DONEPEZIL HCL 5 MG PO TABS
5.0000 mg | ORAL_TABLET | Freq: Every day | ORAL | Status: DC
  Filled 2018-03-02: qty 1

## 2018-03-02 MED ORDER — HYDROCHLOROTHIAZIDE 25 MG PO TABS
25.0000 mg | ORAL_TABLET | Freq: Every day | ORAL | Status: DC
  Filled 2018-03-02: qty 1

## 2018-03-02 MED ORDER — ONDANSETRON HCL 4 MG/2ML IJ SOLN: 4.0000 mg | Freq: Four times a day (QID) | INTRAMUSCULAR | Status: DC | PRN

## 2018-03-02 MED ORDER — FENOFIBRATE 160 MG PO TABS
160.0000 mg | ORAL_TABLET | Freq: Every day | ORAL | Status: DC
  Filled 2018-03-02: qty 1

## 2018-03-02 MED ORDER — PANTOPRAZOLE SODIUM 40 MG PO TBEC
40.0000 mg | DELAYED_RELEASE_TABLET | Freq: Every day | ORAL | Status: DC
  Filled 2018-03-02: qty 1

## 2018-03-02 MED ORDER — FOLIC ACID 1 MG PO TABS
1.0000 mg | ORAL_TABLET | Freq: Every day | ORAL | Status: DC
  Filled 2018-03-02: qty 1

## 2018-03-02 MED ORDER — ISOSORBIDE MONONITRATE ER 30 MG PO TB24
30.0000 mg | ORAL_TABLET | Freq: Every day | ORAL | Status: DC
  Filled 2018-03-02: qty 1

## 2018-03-02 MED ORDER — OMEGA-3-ACID ETHYL ESTERS 1 G PO CAPS
1.0000 | ORAL_CAPSULE | Freq: Two times a day (BID) | ORAL | Status: DC
Start: 2018-03-02 — End: 2018-03-02
  Filled 2018-03-02: qty 1

## 2018-03-02 MED ORDER — ASPIRIN EC 81 MG PO TBEC
81.0000 mg | DELAYED_RELEASE_TABLET | Freq: Every day | ORAL | Status: DC
  Filled 2018-03-02: qty 1

## 2018-03-02 MED ORDER — ONDANSETRON HCL 4 MG PO TABS: 4.0000 mg | ORAL_TABLET | Freq: Four times a day (QID) | ORAL | Status: DC | PRN

## 2018-03-02 MED ORDER — ENOXAPARIN SODIUM 40 MG/0.4ML ~~LOC~~ SOLN: 40.0000 mg | SUBCUTANEOUS | Status: DC

## 2018-03-02 MED ORDER — ACETAMINOPHEN 650 MG RE SUPP: 650.0000 mg | Freq: Four times a day (QID) | RECTAL | Status: DC | PRN

## 2018-03-02 MED ORDER — DOCUSATE SODIUM 100 MG PO CAPS
100.0000 mg | ORAL_CAPSULE | Freq: Two times a day (BID) | ORAL | Status: DC
  Filled 2018-03-02: qty 1

## 2018-03-02 MED ORDER — INSULIN ASPART 100 UNIT/ML ~~LOC~~ SOLN: 0.0000 [IU] | Freq: Every day | SUBCUTANEOUS | Status: DC

## 2018-03-02 MED ORDER — LISINOPRIL 10 MG PO TABS
10.0000 mg | ORAL_TABLET | Freq: Every day | ORAL | Status: DC
  Filled 2018-03-02: qty 1

## 2018-03-02 MED ORDER — INSULIN ASPART 100 UNIT/ML ~~LOC~~ SOLN
0.0000 [IU] | Freq: Three times a day (TID) | SUBCUTANEOUS | Status: DC
  Administered 2018-03-02: 1 [IU] via SUBCUTANEOUS
  Filled 2018-03-02: qty 1

## 2018-03-02 MED ORDER — CLOPIDOGREL BISULFATE 75 MG PO TABS
75.0000 mg | ORAL_TABLET | Freq: Every day | ORAL | Status: DC
  Filled 2018-03-02: qty 1

## 2018-03-02 MED ORDER — CARVEDILOL 3.125 MG PO TABS: 3.1250 mg | ORAL_TABLET | Freq: Two times a day (BID) | ORAL | Status: DC

## 2018-03-02 MED ORDER — ALLOPURINOL 100 MG PO TABS
100.0000 mg | ORAL_TABLET | Freq: Every day | ORAL | Status: DC
  Filled 2018-03-02: qty 1

## 2018-03-02 NOTE — Discharge Summary (Signed)
[facility] [place] [facility]   PATIENT NAME: Juan Webster    MR#:  [id number]  DATE OF BIRTH:  09/17/1935  DATE OF ADMISSION:  03/01/2018 ADMITTING PHYSICIAN: Arnaldo NatalMichael S Diamond, MD  DATE OF DISCHARGE: No discharge date for patient encounter.  PRIMARY CARE PHYSICIAN: Lynnea FerrierKlein, Bert J III, MD    ADMISSION DIAGNOSIS:  Symptomatic bradycardia [R00.1]  DISCHARGE DIAGNOSIS:  Active Problems:   Bradycardia   SECONDARY DIAGNOSIS:   Past Medical History:  Diagnosis Date  . Arthritis   . Chronic kidney disease    CKD -stage 3  . Diabetes mellitus without complication (HCC)   . Diverticulosis   . Dysrhythmia    controlled with pacemaker  . Gout   . Hard of hearing   . History of hiatal hernia   . Hypertension   . IBS (irritable bowel syndrome)   . 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 Presence of permanent cardiac pacemaker 10/17/00, 07/31/07   Medtronic Adapta DR  ADDRL1  . Wears dentures    full upper, partial lower    HOSPITAL COURSE:   This is an 82 year old male admitted for bradycardia  1.  Bradycardia Resolved Cardiology did see patient while in house-no change in intervention was recommended as pacemaker is working appropriately, patient to follow-up with cardiology on Monday or Tuesday for reevaluation status post discharge   2.  CAD Stable continue aspirin, Plavix, Imdur  3.  Hypertension Controlled continue carvedilol, lisinopril and hydrochlorothiazide  4.  Diabetes mellitus type 2 Stable on current regiment  5.  Dyslipidemia Stable Continue fenofibrate  DISCHARGE CONDITIONS:   stable  CONSULTS OBTAINED:  Treatment Team:  Lamar BlinksKowalski, Bruce J, MD Dalia HeadingFath, Kenneth A, MD  DRUG ALLERGIES:   Allergies  Allergen Reactions  . Simvastatin     Muscle weakness    DISCHARGE MEDICATIONS:   Allergies as of 03/02/2018      Reactions   Simvastatin    Muscle weakness      Medication List    TAKE these  medications   acetaminophen 500 MG tablet Commonly known as:  TYLENOL Take 500 mg by mouth every 6 (six) hours as needed.   allopurinol 100 MG tablet Commonly known as:  ZYLOPRIM Take 100 mg by mouth daily.   aspirin 81 MG tablet Take 81 mg by mouth daily.   carvedilol 3.125 MG tablet Commonly known as:  COREG Take 3.125 mg by mouth 2 (two) times daily with a meal.   celecoxib 200 MG capsule Commonly known as:  CELEBREX Take 200 mg by mouth daily.   Cinnamon 500 MG Tabs Take 1,000 mg by mouth 2 (two) times daily.   clopidogrel 75 MG tablet Commonly known as:  PLAVIX Take 75 mg by mouth daily.   donepezil 5 MG tablet Commonly known as:  ARICEPT Take 5 mg by mouth daily.   fenofibrate 145 MG tablet Commonly known as:  TRICOR Take 145 mg by mouth daily.   folic acid 1 MG tablet Commonly known as:  FOLVITE Take 1 mg by mouth daily.   glimepiride 4 MG tablet Commonly known as:  AMARYL Take 4 mg by mouth 2 (two) times daily.   hydrochlorothiazide 25 MG tablet Commonly known as:  HYDRODIURIL Take 25 mg by mouth daily.   isosorbide mononitrate 30 MG 24 hr tablet Commonly known as:  IMDUR Take 1 tablet (30 mg total) by mouth daily.   lisinopril 20 MG tablet Commonly  known as:  PRINIVIL,ZESTRIL Take 0.5 tablets (10 mg total) by mouth daily.   metFORMIN 1000 MG tablet Commonly known as:  GLUCOPHAGE Take 1,000-2,000 mg by mouth 2 (two) times daily with a meal. Take 1 tablet in the morning and 1.5 tablet in the evening   OMEGA-3 FISH OIL PO Take by mouth 2 (two) times daily.   pantoprazole 40 MG tablet Commonly known as:  PROTONIX Take 40 mg by mouth daily.   sitaGLIPtin 100 MG tablet Commonly known as:  JANUVIA Take 100 mg by mouth daily.   traMADol 50 MG tablet Commonly known as:  ULTRAM Take 50 mg by mouth every 4 (four) hours as needed.        DISCHARGE INSTRUCTIONS:    If you experience worsening of your admission symptoms, develop shortness of  breath, life threatening emergency, suicidal or homicidal thoughts you must seek medical attention immediately by calling 911 or calling your MD immediately  if symptoms less severe.  You Must read complete instructions/literature along with all the possible adverse reactions/side effects for all the Medicines you take and that have been prescribed to you. Take any new Medicines after you have completely understood and accept all the possible adverse reactions/side effects.   Please note  You were cared for by a hospitalist during your hospital stay. If you have any questions about your discharge medications or the care you received while you were in the hospital after you are discharged, you can call the unit and asked to speak with the hospitalist on call if the hospitalist that took care of you is not available. Once you are discharged, your primary care physician will handle any further medical issues. Please note that NO REFILLS for any discharge medications will be authorized once you are discharged, as it is imperative that you return to your primary care physician (or establish a relationship with a primary care physician if you do not have one) for your aftercare needs so that they can reassess your need for medications and monitor your lab values.    Today   CHIEF COMPLAINT:   Chief Complaint  Patient presents with  . Weakness    HISTORY OF PRESENT ILLNESS:   The patient with past medical history of diabetes, chronic kidney disease, hypertension and arrhythmia status post pacemaker placement presents to the emergency department complaining of weakness.  He is felt weak all day and checked his pulse which was approximately 40.  He denies chest pain or shortness of breath, nausea or vomiting.  The patient's pacemaker was interrogated in the emergency department and found to have had multiple episodes of V. tach.  Cardiology was called who recommended the patient be placed under  observation for further evaluation of pacemaker sensing capability and/or dangerous arrhythmia.  VITAL SIGNS:  Blood pressure (!) 171/70, pulse 65, temperature 98.2 F (36.8 C), temperature source Oral, resp. rate 20, height 6' (1.829 m), weight 90.8 kg, SpO2 98 %.  I/O:    Intake/Output Summary (Last 24 hours) at 03/02/2018 1140 Last data filed at 03/02/2018 1000 Gross per 24 hour  Intake 360 ml  Output 400 ml  Net -40 ml    PHYSICAL EXAMINATION:  GENERAL:  82 y.o.-year-old patient lying in the bed with no acute distress.  EYES: Pupils equal, round, reactive to light and accommodation. No scleral icterus. Extraocular muscles intact.  HEENT: Head atraumatic, normocephalic. Oropharynx and nasopharynx clear.  NECK:  Supple, no jugular venous distention. No thyroid enlargement, no tenderness.  LUNGS: Normal breath sounds bilaterally, no wheezing, rales,rhonchi or crepitation. No use of accessory muscles of respiration.  CARDIOVASCULAR: S1, S2 normal. No murmurs, rubs, or gallops.  ABDOMEN: Soft, non-tender, non-distended. Bowel sounds present. No organomegaly or mass.  EXTREMITIES: No pedal edema, cyanosis, or clubbing.  NEUROLOGIC: Cranial nerves II through XII are intact. Muscle strength 5/5 in all extremities. Sensation intact. Gait not checked.  PSYCHIATRIC: The patient is alert and oriented x 3.  SKIN: No obvious rash, lesion, or ulcer.   DATA REVIEW:   CBC Recent Labs  Lab 03/01/18 1552  WBC 8.8  HGB 14.0  HCT 42.0  PLT 172    Chemistries  Recent Labs  Lab 03/01/18 1552  NA 140  K 4.1  CL 107  CO2 23  GLUCOSE 93  BUN 34*  CREATININE 1.40*  CALCIUM 9.3  AST 21  ALT 13  ALKPHOS 53  BILITOT 0.7    Cardiac Enzymes Recent Labs  Lab 03/01/18 1552  TROPONINI <0.03    Microbiology Results  No results found for this or any previous visit.  RADIOLOGY:  No results found.  EKG:   Orders placed or performed during the hospital encounter of 03/01/18  .  EKG 12-Lead  . EKG 12-Lead  . EKG 12-Lead  . EKG 12-Lead      Management plans discussed with the patient, family and they are in agreement.  CODE STATUS:     Code Status Orders  (From admission, onward)         Start     Ordered   03/02/18 0004  Full code  Continuous     03/02/18 0003        Code Status History    Date Active Date Inactive Code Status Order ID Comments User Context   07/02/2016 1546 07/04/2016 1434 Full Code [id number]  Auburn Bilberry, MD Inpatient    Advance Directive Documentation     Most Recent Value  Type of Advance Directive  Healthcare Power of Attorney, Living will  Pre-existing out of facility DNR order (yellow form or pink MOST form)  -  "MOST" Form in Place?  -      TOTAL TIME TAKING CARE OF THIS PATIENT: 40 minutes.    Evelena Asa Salary M.D on 03/02/2018 at 11:40 AM  Between 7am to 6pm - Pager - [phone]  After 6pm go to [email or web address] - password [email or web address]  [facility]  Office  [phone]  CC: Primary care physician; Lynnea Ferrier, MD   Note: This dictation was prepared with Dragon dictation along with smaller phrase technology. Any transcriptional errors that result from this process are unintentional.

## 2018-03-02 NOTE — Consult Note (Signed)
[facility] Cardiology Consultation Note  Patient ID: Juan Webster, MRN: [id number], DOB/AGE: 80-03-1935 82 y.o. Admit date: 03/01/2018   Date of Consult: 03/02/2018 Primary Physician: Lynnea Ferrier, MD Primary Cardiologist: Gwen Pounds  Chief Complaint:  Chief Complaint  Patient presents with  . Weakness   Reason for Consult: Slow heart rate  HPI: 82 y.o. male with known diabetes hypertension hyperlipidemia coronary artery disease by cardiac catheterization and mild to moderate LV systolic dysfunction.  The patient does have a pacemaker placement in the past due to heart block.  Recently he has been doing fairly well with no evidence of chest pain or syncope or congestive heart failure but has had a notice of slower heart rate and some sluggishness.  He had a pacemaker evaluation and found that the had some slow heart rate.  When seen in the emergency room he had ventricular bigeminy with pacers showing appropriate pacing but preventricular contraction every other beat.  This gave his pulse 40 bpm.  The patient had no evidence of significant symptoms from that at the time.  With ambulation he is now having continued appropriate ventricular pacing at a higher rate and still some intermittent episodes of preventricular contractions.  Therefore it appears that the pacemaker is working appropriately.  He also has had an interrogation suggesting that there are some a short nonsustained wide-complex episodes for which the patient is not had any symptoms of syncope dizziness nausea or diaphoresis.  Early there is no evidence of myocardial infarction with normal troponin and chest x-ray is normal.  Past Medical History:  Diagnosis Date  . Arthritis   . Chronic kidney disease    CKD -stage 3  . Diabetes mellitus without complication (HCC)   . Diverticulosis   . Dysrhythmia    controlled with pacemaker  . Gout   . Hard of hearing   . History of hiatal hernia   . Hypertension   . IBS  (irritable bowel syndrome)   . 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 Presence of permanent cardiac pacemaker 10/17/00, 07/31/07   Medtronic Adapta DR  ADDRL1  . Wears dentures    full upper, partial lower      Surgical History:  Past Surgical History:  Procedure Laterality Date  . CATARACT EXTRACTION W/PHACO Left 06/30/2015   Procedure: CATARACT EXTRACTION PHACO AND INTRAOCULAR LENS PLACEMENT (IOC);  Surgeon: Lockie Mola, MD;  Location: [facility];  Service: Ophthalmology;  Laterality: Left;  DIABETIC - oral meds  . CATARACT EXTRACTION W/PHACO Right 07/28/2015   Procedure: CATARACT EXTRACTION PHACO AND INTRAOCULAR LENS PLACEMENT (IOC) right eye;  Surgeon: Lockie Mola, MD;  Location: [facility];  Service: Ophthalmology;  Laterality: Right;  DIABETIC - oral meds TORIC  . COLONOSCOPY    . CORONARY ANGIOPLASTY  09/08   stent - RDA  . INSERT / REPLACE / REMOVE PACEMAKER  10/17/00, 07/31/07  . LEFT HEART CATH AND CORONARY ANGIOGRAPHY N/A 07/03/2016   Procedure: Left Heart Cath and Coronary Angiography;  Surgeon: Dalia Heading, MD;  Location: [facility] INVASIVE CV LAB;  Service: Cardiovascular;  Laterality: N/A;  . NECK SURGERY    . SHOULDER SURGERY     x3  . TONSILLECTOMY       Home Meds: Prior to Admission medications   Medication Sig Start Date End Date Taking? Authorizing Provider  acetaminophen (TYLENOL) 500 MG tablet Take 500 mg by mouth every 6 (six) hours as needed.   Yes  [provider]  allopurinol (ZYLOPRIM) 100 MG tablet Take 100 mg by mouth daily.   Yes [provider]  aspirin 81 MG tablet Take 81 mg by mouth daily.   Yes [provider]  carvedilol (COREG) 3.125 MG tablet Take 3.125 mg by mouth 2 (two) times daily with a meal.   Yes [provider]  Cinnamon 500 MG TABS Take 1,000 mg by mouth 2 (two) times daily.    Yes [provider]  clopidogrel (PLAVIX) 75 MG tablet Take 75 mg by mouth daily.  08/27/17 08/27/18 Yes [provider]  donepezil (ARICEPT) 5 MG tablet Take 5 mg by mouth daily. 09/10/17 09/10/18 Yes [provider]  fenofibrate (TRICOR) 145 MG tablet Take 145 mg by mouth daily.   Yes [provider]  folic acid (FOLVITE) 1 MG tablet Take 1 mg by mouth daily.   Yes [provider]  glimepiride (AMARYL) 4 MG tablet Take 4 mg by mouth 2 (two) times daily.   Yes [provider]  hydrochlorothiazide (HYDRODIURIL) 25 MG tablet Take 25 mg by mouth daily.   Yes [provider]  isosorbide mononitrate (IMDUR) 30 MG 24 hr tablet Take 1 tablet (30 mg total) by mouth daily. 07/04/16  Yes Sudini, Wardell Heath, MD  metFORMIN (GLUCOPHAGE) 1000 MG tablet Take 1,000-2,000 mg by mouth 2 (two) times daily with a meal. Take 1 tablet in the morning and 1.5 tablet in the evening   Yes [provider]  Omega-3 Fatty Acids (OMEGA-3 FISH OIL PO) Take by mouth 2 (two) times daily.   Yes [provider]  pantoprazole (PROTONIX) 40 MG tablet Take 40 mg by mouth daily. 09/11/17  Yes [provider]  sitaGLIPtin (JANUVIA) 100 MG tablet Take 100 mg by mouth daily.   Yes [provider]  traMADol (ULTRAM) 50 MG tablet Take 50 mg by mouth every 4 (four) hours as needed.    Yes [provider]  celecoxib (CELEBREX) 200 MG capsule Take 200 mg by mouth daily.    [provider]  lisinopril (PRINIVIL,ZESTRIL) 20 MG tablet Take 0.5 tablets (10 mg total) by mouth daily. Patient not taking: Reported on 03/01/2018 07/04/16   Milagros Loll, MD    Inpatient Medications:  . allopurinol  100 mg Oral Daily  . aspirin EC  81 mg Oral Daily  . carvedilol  3.125 mg Oral BID WC  . clopidogrel  75 mg Oral Daily  . docusate sodium  100 mg Oral BID  . donepezil  5 mg Oral Daily  . enoxaparin (LOVENOX) injection  40 mg Subcutaneous Q24H  . fenofibrate  160 mg Oral Daily  . folic acid  1 mg Oral Daily  . hydrochlorothiazide  25 mg  Oral Daily  . insulin aspart  0-5 Units Subcutaneous QHS  . insulin aspart  0-9 Units Subcutaneous TID WC  . isosorbide mononitrate  30 mg Oral Daily  . lisinopril  10 mg Oral Daily  . omega-3 acid ethyl esters  1 capsule Oral BID  . pantoprazole  40 mg Oral Daily     Allergies:  Allergies  Allergen Reactions  . Simvastatin     Muscle weakness    Social History   Socioeconomic History  . Marital status: Married    Spouse name: Not on file  . Number of children: Not on file  . Years of education: Not on file  . Highest education level: Not on file  Occupational History  . Not  on file  Social Needs  . Financial resource strain: Not on file  . Food insecurity:    Worry: Not on file    Inability: Not on file  . Transportation needs:    Medical: Not on file    Non-medical: Not on file  Tobacco Use  . Smoking status: Never Smoker  . Smokeless tobacco: Never Used  Substance and Sexual Activity  . Alcohol use: No  . Drug use: Not on file  . Sexual activity: Not on file  Lifestyle  . Physical activity:    Days per week: Not on file    Minutes per session: Not on file  . Stress: Not on file  Relationships  . Social connections:    Talks on phone: Not on file    Gets together: Not on file    Attends religious service: Not on file    Active member of club or organization: Not on file    Attends meetings of clubs or organizations: Not on file    Relationship status: Not on file  . Intimate partner violence:    Fear of current or ex partner: Not on file    Emotionally abused: Not on file    Physically abused: Not on file    Forced sexual activity: Not on file  Other Topics Concern  . Not on file  Social History Narrative  . Not on file     History reviewed. No pertinent family history.   Review of Systems Positive for shortness of breath Negative for: General:  chills, fever, night sweats or weight changes.  Cardiovascular: PND orthopnea syncope dizziness   Dermatological skin lesions rashes Respiratory: Cough congestion Urologic: Frequent urination urination at night and hematuria Abdominal: negative for nausea, vomiting, diarrhea, bright red blood per rectum, melena, or hematemesis Neurologic: negative for visual changes, and/or hearing changes  All other systems reviewed and are otherwise negative except as noted above.  Labs: Recent Labs    03/01/18 1552  TROPONINI <0.03   Lab Results  Component Value Date   WBC 8.8 03/01/2018   HGB 14.0 03/01/2018   HCT 42.0 03/01/2018   MCV 86.8 03/01/2018   PLT 172 03/01/2018    Recent Labs  Lab 03/01/18 1552  NA 140  K 4.1  CL 107  CO2 23  BUN 34*  CREATININE 1.40*  CALCIUM 9.3  PROT 7.2  BILITOT 0.7  ALKPHOS 53  ALT 13  AST 21  GLUCOSE 93   Lab Results  Component Value Date   CHOL 152 07/03/2016   HDL 34 (L) 07/03/2016   LDLCALC 55 07/03/2016   TRIG 315 (H) 07/03/2016   No results found for: DDIMER  Radiology/Studies:  No results found.  EKG: Atrial ventricular pacing with intermittent PVCs  Weights: Filed Weights   03/01/18 1507 03/02/18 0005 03/02/18 0100  Weight: 90.3 kg 88.6 kg 90.8 kg     Physical Exam: Blood pressure (!) 171/70, pulse 65, temperature 98.2 F (36.8 C), temperature source Oral, resp. rate 20, height 6' (1.829 m), weight 90.8 kg, SpO2 98 %. Body mass index is 27.15 kg/m. General: Well developed, well nourished, in no acute distress. Head eyes ears nose throat: Normocephalic, atraumatic, sclera non-icteric, no xanthomas, nares are without discharge. No apparent thyromegaly and/or mass  Lungs: Normal respiratory effort.  no wheezes, no rales, no rhonchi.  Heart: irr with normal S1 S2. no murmur gallop, no rub, PMI is normal size and placement, carotid upstroke normal without bruit, jugular venous  pressure is normal Abdomen: Soft, non-tender, non-distended with normoactive bowel sounds. No hepatomegaly. No rebound/guarding. No obvious  abdominal masses. Abdominal aorta is normal size without bruit Extremities: No edema. no cyanosis, no clubbing, no ulcers  Peripheral : 2+ bilateral upper extremity pulses, 2+ bilateral femoral pulses, 2+ bilateral dorsal pedal pulse Neuro: Alert and oriented. No facial asymmetry. No focal deficit. Moves all extremities spontaneously. Musculoskeletal: Normal muscle tone without kyphosis Psych:  Responds to questions appropriately with a normal affect.    Assessment: 82 year old male with hypertension hyperlipidemia heart block status post pacemaker placement doing relatively well on appropriate medication management without evidence of heart failure myocardial infarction having ventricular bigeminy and preventricular contractions likely causing lower pulse but no evidence of significant symptoms at this time  Plan: 1.  Continue ambulation as needed and follow-up for any further significant symptoms 2.  Okay to continue medication management for cardiovascular disease including beta-blocker ACE inhibitor 3.  High intensity cholesterol therapy 4.  If ambulating well okay for discharge home from cardiac standpoint and follow-up next week for further interrogation of pacemaker and adjustments of medication management  Signed, Lamar BlinksBruce J Kowalski M.D. [facility] 03/02/2018, 9:59 AM

## 2019-06-09 ENCOUNTER — Ambulatory Visit: Payer: Medicare HMO | Admitting: Occupational Therapy

## 2019-10-08 ENCOUNTER — Other Ambulatory Visit: Payer: Self-pay | Admitting: Internal Medicine

## 2019-10-08 DIAGNOSIS — R1031 Right lower quadrant pain: Secondary | ICD-10-CM

## 2019-10-15 ENCOUNTER — Ambulatory Visit
Admission: RE | Admit: 2019-10-15 | Discharge: 2019-10-15 | Disposition: A | Discharge status: Home / Self Care | Payer: Medicare HMO | Source: Ambulatory Visit | Attending: Internal Medicine | Admitting: Internal Medicine

## 2019-10-15 ENCOUNTER — Other Ambulatory Visit: Payer: Self-pay

## 2019-10-15 DIAGNOSIS — R1031 Right lower quadrant pain: Secondary | ICD-10-CM | POA: Insufficient documentation

## 2019-10-15 MED ORDER — IOHEXOL 300 MG/ML  SOLN
75.0000 mL | Freq: Once | INTRAMUSCULAR | Status: AC | PRN
  Administered 2019-10-15: 75 mL via INTRAVENOUS

## 2020-06-08 ENCOUNTER — Other Ambulatory Visit: Payer: Self-pay | Admitting: Family Medicine

## 2020-06-08 DIAGNOSIS — M4807 Spinal stenosis, lumbosacral region: Secondary | ICD-10-CM

## 2020-06-21 ENCOUNTER — Ambulatory Visit
Admission: RE | Admit: 2020-06-21 | Discharge: 2020-06-21 | Disposition: A | Discharge status: Home / Self Care | Payer: Medicare HMO | Source: Ambulatory Visit | Attending: Family Medicine | Admitting: Family Medicine

## 2020-06-21 ENCOUNTER — Other Ambulatory Visit: Payer: Self-pay

## 2020-06-21 DIAGNOSIS — M4807 Spinal stenosis, lumbosacral region: Secondary | ICD-10-CM | POA: Diagnosis not present

## 2020-07-28 ENCOUNTER — Other Ambulatory Visit: Payer: Self-pay

## 2020-07-28 ENCOUNTER — Encounter: Payer: Self-pay | Admitting: Urology

## 2020-07-28 ENCOUNTER — Ambulatory Visit: Payer: Medicare HMO | Admitting: Urology

## 2020-07-28 VITALS — BP 149/70 | HR 84 | Ht 73.0 in | Wt 193.0 lb

## 2020-07-28 DIAGNOSIS — R3129 Other microscopic hematuria: Secondary | ICD-10-CM | POA: Diagnosis not present

## 2020-07-28 DIAGNOSIS — N5082 Scrotal pain: Secondary | ICD-10-CM

## 2020-07-28 NOTE — Progress Notes (Signed)
07/28/2020 9:12 PM   Juan Webster 10-03-1935 [id number]  Referring provider: Lynnea Ferrier, MD [address] [facility] [place],  [place] [address]  Chief Complaint  Patient presents with  . Testicle Pain    HPI: Juan Webster is an 85 y.o. seen at request of Dr. Graciela Husbands for evaluation of chronic left scrotal pain.   Was seen earlier this month complaining of left scrotal pain described as mild to moderate without identifiable precipitating, aggravating or alleviating factors  Intermittent symptoms for the past several months  Urinalysis showed 4-10 RBC and urine culture was negative  Treated with ciprofloxacin for epididymitis  Minimal improvement and the medication was refilled  Symptoms currently are mild no bothersome lower urinary tract symptoms  Denies dysuria or gross hematuria  Denies flank, abdominal pain  Followed by Physical Medicine for degenerative disc disease    PMH: Past Medical History:  Diagnosis Date  . Arthritis   . Chronic kidney disease    CKD -stage 3  . Diabetes mellitus without complication (HCC)   . Diverticulosis   . Dysrhythmia    controlled with pacemaker  . Gout   . Hard of hearing   . History of hiatal hernia   . Hypertension   . IBS (irritable bowel syndrome)   . 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 Presence of permanent cardiac pacemaker 10/17/00, 07/31/07   Medtronic Adapta DR  ADDRL1  . Wears dentures    full upper, partial lower    Surgical History: Past Surgical History:  Procedure Laterality Date  . CATARACT EXTRACTION W/PHACO Left 06/30/2015   Procedure: CATARACT EXTRACTION PHACO AND INTRAOCULAR LENS PLACEMENT (IOC);  Surgeon: Lockie Mola, MD;  Location: [facility];  Service: Ophthalmology;  Laterality: Left;  DIABETIC - oral meds  . CATARACT EXTRACTION W/PHACO Right 07/28/2015   Procedure: CATARACT EXTRACTION PHACO AND INTRAOCULAR LENS PLACEMENT (IOC) right eye;  Surgeon:  Lockie Mola, MD;  Location: [facility];  Service: Ophthalmology;  Laterality: Right;  DIABETIC - oral meds TORIC  . COLONOSCOPY    . CORONARY ANGIOPLASTY  09/08   stent - RDA  . INSERT / REPLACE / REMOVE PACEMAKER  10/17/00, 07/31/07  . LEFT HEART CATH AND CORONARY ANGIOGRAPHY N/A 07/03/2016   Procedure: Left Heart Cath and Coronary Angiography;  Surgeon: Dalia Heading, MD;  Location: [facility] INVASIVE CV LAB;  Service: Cardiovascular;  Laterality: N/A;  . NECK SURGERY    . SHOULDER SURGERY     x3  . TONSILLECTOMY      Home Medications:  Allergies as of 07/28/2020      Reactions   Dapagliflozin Other (See Comments)   And worsening renal function   Donepezil Other (See Comments)   Patient has bad dreams   Simvastatin    Muscle weakness      Medication List       Accurate as of Jul 28, 2020  9:12 PM. If you have any questions, ask your nurse or doctor.        acetaminophen 500 MG tablet Commonly known as: TYLENOL Take 500 mg by mouth every 6 (six) hours as needed.   allopurinol 100 MG tablet Commonly known as: ZYLOPRIM Take 100 mg by mouth daily.   aspirin 81 MG tablet Take 81 mg by mouth daily.   carvedilol 3.125 MG tablet Commonly known as: COREG Take 3.125 mg by mouth 2 (two) times daily with a meal.  celecoxib 200 MG capsule Commonly known as: CELEBREX Take 200 mg by mouth daily.   Cinnamon 500 MG Tabs Take 1,000 mg by mouth 2 (two) times daily.   donepezil 5 MG tablet Commonly known as: ARICEPT Take 5 mg by mouth daily.   fenofibrate 145 MG tablet Commonly known as: TRICOR Take 145 mg by mouth daily.   folic acid 1 MG tablet Commonly known as: FOLVITE Take 1 mg by mouth daily.   glimepiride 4 MG tablet Commonly known as: AMARYL Take 4 mg by mouth 2 (two) times daily.   hydrochlorothiazide 25 MG tablet Commonly known as: HYDRODIURIL Take 25 mg by mouth daily.   isosorbide mononitrate 30 MG 24 hr tablet Commonly known as:  IMDUR Take 1 tablet (30 mg total) by mouth daily.   lisinopril 20 MG tablet Commonly known as: ZESTRIL Take 0.5 tablets (10 mg total) by mouth daily.   metFORMIN 1000 MG tablet Commonly known as: GLUCOPHAGE Take 1,000-2,000 mg by mouth 2 (two) times daily with a meal. Take 1 tablet in the morning and 1.5 tablet in the evening   OMEGA-3 FISH OIL PO Take by mouth 2 (two) times daily.   pantoprazole 40 MG tablet Commonly known as: PROTONIX Take 40 mg by mouth daily.   sitaGLIPtin 100 MG tablet Commonly known as: JANUVIA Take 100 mg by mouth daily.   traMADol 50 MG tablet Commonly known as: ULTRAM Take 50 mg by mouth every 4 (four) hours as needed.       Allergies:  Allergies  Allergen Reactions  . Dapagliflozin Other (See Comments)    And worsening renal function  . Donepezil Other (See Comments)    Patient has bad dreams  . Simvastatin     Muscle weakness    Family History: History reviewed. No pertinent family history.  Social History:  reports that he has never smoked. He has never used smokeless tobacco. He reports that he does not drink alcohol. No history on file for drug use.   Physical Exam: BP (!) 149/70   Pulse 84   Ht 6\' 1"  (1.854 m)   Wt 193 lb (87.5 kg)   BMI 25.46 kg/m   Constitutional:  Alert and oriented, No acute distress. HEENT: [place] AT, moist mucus membranes.  Trachea midline, no masses. Cardiovascular: No clubbing, cyanosis, or edema. Respiratory: Normal respiratory effort, no increased work of breathing. GI: Abdomen is soft, nontender, nondistended, no abdominal masses GU: Phallus without lesions; scrotum normal without erythema or skin thickening.  Testes descended bilaterally without masses.  Mild left testicular tenderness.  Some fullness superior to left testis which may be a cord lipoma.  Epididymis palpably normal Lymph: No cervical or inguinal lymphadenopathy. Skin: No rashes, bruises or suspicious lesions. Neurologic: Grossly  intact, no focal deficits, moving all 4 extremities. Psychiatric: Normal mood and affect.   Assessment & Plan:    1.  Left scrotal pain  Mild fullness superior to left testis on exam  Recommend scheduling scrotal sonogram and will call with results  2.  Microhematuria  Based on age AUA risk stratification: High  We discussed the standard recommend evaluation for high risk hematuria to include CT urogram and cystoscopy  He had a CT of the abdomen pelvis with contrast August 2021 which showed nonobstructing right lower pole renal calculi and a left renal cyst  Will await his scrotal ultrasound before proceeding with a hematuria evaluation    September 2021, MD  [facility] [address]  [address], [place], [place] [address] [phone]

## 2020-07-30 ENCOUNTER — Other Ambulatory Visit: Payer: Self-pay | Admitting: Internal Medicine

## 2020-07-30 DIAGNOSIS — R1319 Other dysphagia: Secondary | ICD-10-CM

## 2020-08-03 ENCOUNTER — Ambulatory Visit
Admission: RE | Admit: 2020-08-03 | Discharge: 2020-08-03 | Disposition: A | Discharge status: Home / Self Care | Payer: Medicare HMO | Source: Ambulatory Visit | Attending: Internal Medicine | Admitting: Internal Medicine

## 2020-08-03 ENCOUNTER — Ambulatory Visit
Admission: RE | Admit: 2020-08-03 | Discharge: 2020-08-03 | Disposition: A | Discharge status: Home / Self Care | Payer: Medicare HMO | Source: Ambulatory Visit | Attending: Urology | Admitting: Urology

## 2020-08-03 DIAGNOSIS — R1319 Other dysphagia: Secondary | ICD-10-CM | POA: Diagnosis present

## 2020-08-03 DIAGNOSIS — N5082 Scrotal pain: Secondary | ICD-10-CM

## 2020-08-05 ENCOUNTER — Telehealth: Payer: Self-pay | Admitting: Urology

## 2020-08-05 NOTE — Telephone Encounter (Signed)
Scrotal sonogram showed a fairly large cyst in the left scrotum which may be the cause of his intermittent pain and not infection.  Depending on the degree of pain options include observation or surgical removal.  Please let me know if he has any questions.  If he desires observation would recommend a follow-up visit in 3 months

## 2020-08-10 NOTE — Telephone Encounter (Signed)
Notified patient as instructed, patient would like to due surgery . May left a message per patient.

## 2020-08-13 NOTE — Telephone Encounter (Signed)
Scheduling sheet given to [facility]

## 2020-08-16 NOTE — Progress Notes (Signed)
  Perioperative Services Pre-Admission/Anesthesia Testing     Date: 08/16/20  Name: Juan Webster MRN:   [id number]  Re: Request from surgery for clearance prior to scheduled procedure  Patient is scheduled to undergo a hydrocelectomy versus spermatocelectomy on 08/25/2020 with Dr. Irineo Axon. Patient has not been scheduled for his PAT appointment at this point, thus has not undergone review by PAT RN and/or APP. Received communication from primary attending surgeon's office requesting that patient be submitted for clearance from cardiology.  Additionally, patient has a PPM in place and will need perioperative management orders for his cardiac device.  Clearance documents generated and faxed to appropriate provider(s) as noted below. Note will be updated to reflect communication with provider's office as it relates to clearance being provided and/or the need for office visit prior to clearance for surgery being issued.   PROVIDER SPECIALTY FAXED TO FAXED DATE  Arnoldo Hooker, MD  Cardiology  [phone] 08/16/20   Quentin Mulling, MSN, APRN, FNP-C, CEN [facility]  Peri-operative Services Nurse Practitioner Phone: [phone] 08/16/20 1:46 PM

## 2020-08-20 ENCOUNTER — Other Ambulatory Visit: Payer: Self-pay

## 2020-08-20 ENCOUNTER — Telehealth: Payer: Self-pay | Admitting: *Deleted

## 2020-08-20 ENCOUNTER — Encounter: Payer: Self-pay | Admitting: Urology

## 2020-08-20 ENCOUNTER — Ambulatory Visit: Payer: Medicare HMO | Admitting: Urology

## 2020-08-20 VITALS — BP 146/64 | HR 88 | Ht 73.0 in | Wt 190.0 lb

## 2020-08-20 DIAGNOSIS — N5082 Scrotal pain: Secondary | ICD-10-CM

## 2020-08-20 DIAGNOSIS — R3129 Other microscopic hematuria: Secondary | ICD-10-CM

## 2020-08-20 DIAGNOSIS — N433 Hydrocele, unspecified: Secondary | ICD-10-CM | POA: Diagnosis not present

## 2020-08-20 LAB — URINALYSIS, COMPLETE
Bilirubin, UA: NEGATIVE
Glucose, UA: NEGATIVE
Ketones, UA: NEGATIVE
Leukocytes,UA: NEGATIVE
Nitrite, UA: NEGATIVE
Specific Gravity, UA: 1.02 (ref 1.005–1.030)
Urobilinogen, Ur: 0.2 mg/dL (ref 0.2–1.0)
pH, UA: 6.5 (ref 5.0–7.5)

## 2020-08-20 LAB — MICROSCOPIC EXAMINATION
Bacteria, UA: NONE SEEN
WBC, UA: NONE SEEN /hpf (ref 0–5)

## 2020-08-20 NOTE — H&P (View-Only) (Signed)
08/20/2020 9:08 AM   Juan Webster May 05, 1935 [id number]  Referring provider: Lynnea Ferrier, MD [address] [facility] [place],  [place] [address]  Chief Complaint  Patient presents with   Testicle Pain    HPI: 85 y.o. male seen 07/28/2020 for chronic left scrotal pain presents for follow-up.  Scrotal ultrasound 08/03/2020 showed a 7.9 x 3.2 x 4 cm cystic lesion posterior to the left testis with thin septations felt consistent with a hydrocele.  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   Presence of permanent cardiac pacemaker 10/17/00, 07/31/07   Medtronic Adapta DR  [id number]   Wears dentures    full upper, partial lower    Surgical History: Past Surgical History:  Procedure Laterality Date   CATARACT EXTRACTION W/PHACO Left 06/30/2015   Procedure: CATARACT EXTRACTION PHACO AND INTRAOCULAR LENS PLACEMENT (IOC);  Surgeon: Lockie Mola, MD;  Location: [facility];  Service: Ophthalmology;  Laterality: Left;  DIABETIC - oral meds   CATARACT EXTRACTION W/PHACO Right 07/28/2015   Procedure: CATARACT EXTRACTION PHACO AND INTRAOCULAR LENS PLACEMENT (IOC) right eye;  Surgeon: Lockie Mola, MD;  Location: [facility];  Service: Ophthalmology;  Laterality: Right;  DIABETIC - oral meds TORIC   COLONOSCOPY     CORONARY ANGIOPLASTY  09/08   stent - RDA   INSERT / REPLACE / REMOVE PACEMAKER  10/17/00, 07/31/07   LEFT HEART CATH AND CORONARY ANGIOGRAPHY N/A  07/03/2016   Procedure: Left Heart Cath and Coronary Angiography;  Surgeon: Dalia Heading, MD;  Location: [facility] INV[facility] CV [facility];  Service: Cardiovascular;  Laterality: N/A;   NECK SURGERY     SHOULDER SURGERY     x3   TONSILLECTOMY      Home Medications:  Allergies as of 08/20/2020       Reactions   Dapagliflozin Other (See Comments)   And worsening renal function   Donepezil Other (See Comments)   Patient has bad dreams   Simvastatin    Muscle weakness        Medication List        Accurate as of August 20, 2020  9:08 AM. If you have any questions, ask your nurse or doctor.          acetaminophen 500 MG tablet Commonly known as: TYLENOL Take 500 mg by mouth every 6 (six) hours as needed for moderate pain.   albuterol 108 (90 Base) MCG/ACT inhaler Commonly known as: VENTOLIN HFA Inhale 1-2 puffs into the lungs every 6 (six) hours as needed for wheezing or shortness of breath.   aspirin 81 MG tablet Take 81 mg by mouth daily.   azelastine 0.1 % nasal spray Commonly known as: ASTELIN Place 1 spray into both nostrils 2 (two) times daily as needed for allergies or rhinitis.   carvedilol 12.5 MG tablet Commonly known as:  COREG Take 12.5 mg by mouth 2 (two) times daily with a meal.   clopidogrel 75 MG tablet Commonly known as: PLAVIX Take 75 mg by mouth daily.   donepezil 5 MG tablet Commonly known as: ARICEPT Take 5 mg by mouth daily.   Fenofibric Acid 135 MG Cpdr Take 135 mg by mouth daily.   folic acid 1 MG tablet Commonly known as: FOLVITE Take 1 mg by mouth daily.   glimepiride 4 MG tablet Commonly known as: AMARYL Take 4 mg by mouth 2 (two) times daily.   hydrochlorothiazide 25 MG tablet Commonly known as: HYDRODIURIL Take 25 mg by mouth daily.   hydroxychloroquine 200 MG tablet Commonly known as: PLAQUENIL Take 200 mg by mouth daily.   isosorbide mononitrate 30 MG 24 hr tablet Commonly known as: IMDUR Take 1 tablet (30 mg total) by mouth  daily.   lisinopril 20 MG tablet Commonly known as: ZESTRIL Take 0.5 tablets (10 mg total) by mouth daily.   metFORMIN 1000 MG tablet Commonly known as: GLUCOPHAGE Take 1,000 mg by mouth 2 (two) times daily with a meal.   Omega-3 Fish Oil 1200 MG Caps Take 1,200 mg by mouth 2 (two) times daily.   Ozempic (0.25 or 0.5 MG/DOSE) 2 MG/1.5ML Sopn Generic drug: Semaglutide(0.25 or 0.5MG /DOS) Inject 0.25 mg into the skin every Sunday.   pantoprazole 40 MG tablet Commonly known as: PROTONIX Take 40 mg by mouth 2 (two) times daily.   traMADol 50 MG tablet Commonly known as: ULTRAM Take 50 mg by mouth in the morning and at bedtime.        Allergies:  Allergies  Allergen Reactions   Dapagliflozin Other (See Comments)    And worsening renal function   Donepezil Other (See Comments)    Patient has bad dreams   Simvastatin     Muscle weakness    Family History: History reviewed. No pertinent family history.  Social History:  reports that he has never smoked. He has never used smokeless tobacco. He reports that he does not drink alcohol. No history on file for drug use.   Physical Exam: BP (!) 146/64   Pulse 88   Ht 6\' 1"  (1.854 m)   Wt 190 lb (86.2 kg)   BMI 25.07 kg/m   Constitutional:  Alert and oriented, No acute distress. HEENT: [place] AT, moist mucus membranes.  Trachea midline, no masses. Cardiovascular: No clubbing, cyanosis, or edema.  RRR Respiratory: Normal respiratory effort, clear to auscultation. GI: Abdomen is soft, nontender, nondistended, no abdominal masses GU: Phallus without lesions, testes descended bilaterally without masses or tenderness cystic lesion palpable superior to testis.  No palpable inguinal hernia Skin: No rashes, bruises or suspicious lesions. Neurologic: Grossly intact, no focal deficits, moving all 4 extremities. Psychiatric: Normal mood and affect.   Pertinent Imaging: Ultrasound and prior CT images were personally reviewed and  interpreted   Assessment & Plan:    1.  Left hydrocele The testis is easily palpable and this may be a large spermatocele No evidence of hernia He has chronic left hemiscrotal pain and we discussed that there is no guarantee that the cystic lesion is the source of his pain and he may continue to have persistent scrotal pain.  He indicated he understood but states the area is getting larger and he would like to have it excised.  We discussed there is no suspicion of malignancy.  Potential risks were discussed including bleeding/hematoma and infection/abscess which could require secondary procedures for drainage.  All questions were answered and he desires to proceed  2.  Microhematuria 1 recent UA showing 4-10 RBCs.  CT August 2021 with contrast showed nonobstructing right renal calculi Flexible cystoscopy at time of the above procedure    Riki Altes, MD  [facility] [address] [place], [place] [address] [phone]

## 2020-08-20 NOTE — Telephone Encounter (Signed)
-----   Message from Riki Altes, MD sent at 08/20/2020  9:11 AM EDT ----- Regarding: CT Please let Mr.Cirrincione I reviewed the CT Dr. Graciela Husbands ordered August 2021 and there is no evidence of hernia.  We do not need to repeat the CT and will proceed with scheduled surgery as planned

## 2020-08-20 NOTE — Progress Notes (Signed)
08/20/2020 9:08 AM   Juan Webster May 05, 1935 [id number]  Referring provider: Lynnea Ferrier, MD [address] [facility] [place],  [place] [address]  Chief Complaint  Patient presents with   Testicle Pain    HPI: 85 y.o. male seen 07/28/2020 for chronic left scrotal pain presents for follow-up.  Scrotal ultrasound 08/03/2020 showed a 7.9 x 3.2 x 4 cm cystic lesion posterior to the left testis with thin septations felt consistent with a hydrocele.  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   Presence of permanent cardiac pacemaker 10/17/00, 07/31/07   Medtronic Adapta DR  ADDRL1   Wears dentures    full upper, partial lower    Surgical History: Past Surgical History:  Procedure Laterality Date   CATARACT EXTRACTION W/PHACO Left 06/30/2015   Procedure: CATARACT EXTRACTION PHACO AND INTRAOCULAR LENS PLACEMENT (IOC);  Surgeon: Lockie Mola, MD;  Location: [facility];  Service: Ophthalmology;  Laterality: Left;  DIABETIC - oral meds   CATARACT EXTRACTION W/PHACO Right 07/28/2015   Procedure: CATARACT EXTRACTION PHACO AND INTRAOCULAR LENS PLACEMENT (IOC) right eye;  Surgeon: Lockie Mola, MD;  Location: [facility];  Service: Ophthalmology;  Laterality: Right;  DIABETIC - oral meds TORIC   COLONOSCOPY     CORONARY ANGIOPLASTY  09/08   stent - RDA   INSERT / REPLACE / REMOVE PACEMAKER  10/17/00, 07/31/07   LEFT HEART CATH AND CORONARY ANGIOGRAPHY N/A  07/03/2016   Procedure: Left Heart Cath and Coronary Angiography;  Surgeon: Dalia Heading, MD;  Location: [facility];  Service: Cardiovascular;  Laterality: N/A;   NECK SURGERY     SHOULDER SURGERY     x3   TONSILLECTOMY      Home Medications:  Allergies as of 08/20/2020       Reactions   Dapagliflozin Other (See Comments)   And worsening renal function   Donepezil Other (See Comments)   Patient has bad dreams   Simvastatin    Muscle weakness        Medication List        Accurate as of August 20, 2020  9:08 AM. If you have any questions, ask your nurse or doctor.          acetaminophen 500 MG tablet Commonly known as: TYLENOL Take 500 mg by mouth every 6 (six) hours as needed for moderate pain.   albuterol 108 (90 Base) MCG/ACT inhaler Commonly known as: VENTOLIN HFA Inhale 1-2 puffs into the lungs every 6 (six) hours as needed for wheezing or shortness of breath.   aspirin 81 MG tablet Take 81 mg by mouth daily.   azelastine 0.1 % nasal spray Commonly known as: ASTELIN Place 1 spray into both nostrils 2 (two) times daily as needed for allergies or rhinitis.   carvedilol 12.5 MG tablet Commonly known as:  COREG Take 12.5 mg by mouth 2 (two) times daily with a meal.   clopidogrel 75 MG tablet Commonly known as: PLAVIX Take 75 mg by mouth daily.   donepezil 5 MG tablet Commonly known as: ARICEPT Take 5 mg by mouth daily.   Fenofibric Acid 135 MG Cpdr Take 135 mg by mouth daily.   folic acid 1 MG tablet Commonly known as: FOLVITE Take 1 mg by mouth daily.   glimepiride 4 MG tablet Commonly known as: AMARYL Take 4 mg by mouth 2 (two) times daily.   hydrochlorothiazide 25 MG tablet Commonly known as: HYDRODIURIL Take 25 mg by mouth daily.   hydroxychloroquine 200 MG tablet Commonly known as: PLAQUENIL Take 200 mg by mouth daily.   isosorbide mononitrate 30 MG 24 hr tablet Commonly known as: IMDUR Take 1 tablet (30 mg total) by mouth  daily.   lisinopril 20 MG tablet Commonly known as: ZESTRIL Take 0.5 tablets (10 mg total) by mouth daily.   metFORMIN 1000 MG tablet Commonly known as: GLUCOPHAGE Take 1,000 mg by mouth 2 (two) times daily with a meal.   Omega-3 Fish Oil 1200 MG Caps Take 1,200 mg by mouth 2 (two) times daily.   Ozempic (0.25 or 0.5 MG/DOSE) 2 MG/1.5ML Sopn Generic drug: Semaglutide(0.25 or 0.5MG /DOS) Inject 0.25 mg into the skin every Sunday.   pantoprazole 40 MG tablet Commonly known as: PROTONIX Take 40 mg by mouth 2 (two) times daily.   traMADol 50 MG tablet Commonly known as: ULTRAM Take 50 mg by mouth in the morning and at bedtime.        Allergies:  Allergies  Allergen Reactions   Dapagliflozin Other (See Comments)    And worsening renal function   Donepezil Other (See Comments)    Patient has bad dreams   Simvastatin     Muscle weakness    Family History: History reviewed. No pertinent family history.  Social History:  reports that he has never smoked. He has never used smokeless tobacco. He reports that he does not drink alcohol. No history on file for drug use.   Physical Exam: BP (!) 146/64   Pulse 88   Ht 6\' 1"  (1.854 m)   Wt 190 lb (86.2 kg)   BMI 25.07 kg/m   Constitutional:  Alert and oriented, No acute distress. HEENT: [place] AT, moist mucus membranes.  Trachea midline, no masses. Cardiovascular: No clubbing, cyanosis, or edema.  RRR Respiratory: Normal respiratory effort, clear to auscultation. GI: Abdomen is soft, nontender, nondistended, no abdominal masses GU: Phallus without lesions, testes descended bilaterally without masses or tenderness cystic lesion palpable superior to testis.  No palpable inguinal hernia Skin: No rashes, bruises or suspicious lesions. Neurologic: Grossly intact, no focal deficits, moving all 4 extremities. Psychiatric: Normal mood and affect.   Pertinent Imaging: Ultrasound and prior CT images were personally reviewed and  interpreted   Assessment & Plan:    1.  Left hydrocele The testis is easily palpable and this may be a large spermatocele No evidence of hernia He has chronic left hemiscrotal pain and we discussed that there is no guarantee that the cystic lesion is the source of his pain and he may continue to have persistent scrotal pain.  He indicated he understood but states the area is getting larger and he would like to have it excised.  We discussed there is no suspicion of malignancy.  Potential risks were discussed including bleeding/hematoma and infection/abscess which could require secondary procedures for drainage.  All questions were answered and he desires to proceed  2.  Microhematuria 1 recent UA showing 4-10 RBCs.  CT August 2021 with contrast showed nonobstructing right renal calculi Flexible cystoscopy at time of the above procedure    Riki Altes, MD  [facility] [address] [place], [place] [address] [phone]

## 2020-08-23 ENCOUNTER — Other Ambulatory Visit: Payer: Self-pay | Admitting: Urology

## 2020-08-23 DIAGNOSIS — N5082 Scrotal pain: Secondary | ICD-10-CM

## 2020-08-23 DIAGNOSIS — R3129 Other microscopic hematuria: Secondary | ICD-10-CM

## 2020-08-25 ENCOUNTER — Other Ambulatory Visit
Admission: RE | Admit: 2020-08-25 | Discharge: 2020-08-25 | Disposition: A | Discharge status: Home / Self Care | Payer: Medicare HMO | Source: Ambulatory Visit | Attending: Urology | Admitting: Urology

## 2020-08-25 ENCOUNTER — Other Ambulatory Visit: Payer: Self-pay

## 2020-08-25 HISTORY — DX: Personal history of urinary calculi: Z87.442

## 2020-08-25 NOTE — Patient Instructions (Addendum)
Your procedure is scheduled on: Tuesday August 31, 2020. Report to Day Surgery inside [facility] 2nd floor (stop by admissions desk first before getting on elevator) To find out your arrival time please call [phone] between 1PM - 3PM on Monday August 30, 2020.  Remember: Instructions that are not followed completely may result in serious medical risk,  up to and including death, or upon the discretion of your surgeon and anesthesiologist your  surgery may need to be rescheduled.     _X__ 1. Do not eat food or drink fluids after midnight the night before your procedure.                 No chewing gum or hard candies.   __X__2.  On the morning of surgery brush your teeth with toothpaste and water, you                may rinse your mouth with mouthwash if you wish.  Do not swallow any toothpaste of mouthwash.     _X__ 3.  No Alcohol for 24 hours before or after surgery.   _X__ 4.  Do Not Smoke or use e-cigarettes For 24 Hours Prior to Your Surgery.                 Do not use any chewable tobacco products for at least 6 hours prior to                 Surgery.  _X__  5.  Do not use any recreational drugs (marijuana, cocaine, heroin, ecstasy, MDMA or other)                For at least one week prior to your surgery.  Combination of these drugs with anesthesia                May have life threatening results.  __X__6.  Notify your doctor if there is any change in your medical condition      (cold, fever, infections).     Do not wear jewelry, make-up, hairpins, clips or nail polish. Do not wear lotions, powders, or perfumes. You may wear deodorant. Do not shave 48 hours prior to surgery. Men may shave face and neck. Do not bring valuables to the hospital.    [facility] is not responsible for any belongings or valuables.  Contacts, dentures or bridgework may not be worn into surgery. Leave your suitcase in the car. After surgery it may be brought to your  room. For patients admitted to the hospital, discharge time is determined by your treatment team.   Patients discharged the day of surgery will not be allowed to drive home.   Make arrangements for someone to be with you for the first 24 hours of your Same Day Discharge.   __X__ Take these medicines the morning of surgery with A SIP OF WATER:    1. carvedilol (COREG) 12.5 MG   2. hydroxychloroquine (PLAQUENIL) 200 MG   3. isosorbide mononitrate (IMDUR) 30 MG  4. pantoprazole (PROTONIX) 40 MG  5.  6.  ____ Fleet Enema (as directed)   __X__ Use Antibacterial Soap as directed  ____ Use Benzoyl Peroxide Gel as instructed  __X__ Use inhalers on the day of surgery  albuterol (VENTOLIN HFA) 108 (90 Base) MCG/ACT inhaler  __X__ Stop metFORMIN (GLUCOPHAGE) 1000 MG 2 days prior to surgery (last dose will be Sunday 08/29/20)    ____ Take 1/2 of usual insulin dose the night  before surgery. No insulin the morning          of surgery.   __X__ Stop  aspirin 81 MG as instructed by your provider.  __X__ One Week prior to surgery- Stop Anti-inflammatories such as Ibuprofen, Aleve, Advil, Motrin, meloxicam (MOBIC), diclofenac, etodolac, ketorolac, Toradol, Daypro, piroxicam, Goody's or BC powders. OK TO USE TYLENOL IF NEEDED   __X__ Stop supplements until after surgery. folic acid (FOLVITE), Choline Fenofibrate (FENOFIBRIC ACID and Omega-3 Fatty Acids (OMEGA-3 FISH OIL  ____ Bring C-Pap to the hospital.    If you have any questions regarding your pre-procedure instructions,  Please call Pre-admit Testing at [phone].

## 2020-08-25 NOTE — Telephone Encounter (Signed)
Notified patient as instructed, patient pleased. Discussed follow-up appointments, patient agrees  

## 2020-08-26 ENCOUNTER — Encounter: Payer: Self-pay | Admitting: Urology

## 2020-08-26 LAB — CULTURE, URINE COMPREHENSIVE

## 2020-08-26 NOTE — Progress Notes (Signed)
Perioperative Services  Pre-Admission/Anesthesia Testing Clinical Review  Date: 08/26/20  Patient Demographics:  Name: Juan Webster DOB:   April 30, 1935 MRN:   [id number]  Planned Surgical Procedure(s):    Case: [id number] Date/Time: 08/31/20 1035   Procedures:      HYDROCELECTOMY ADULT VS. SPERMATOCELECTOMY (Left)     CYSTOSCOPY/FLEXIBLE     SPERMATOCELECTOMY   Anesthesia type: Choice   Pre-op diagnosis: left scrotal mass,microhematuria   Location: [facility] OR ROOM 10 / [place] [facility]   Surgeons: Abbie Sons, MD     NOTE: Available PAT nursing documentation and vital signs have been reviewed. Clinical nursing staff has updated patient's PMH/PSHx, current medication list, and drug allergies/intolerances to ensure comprehensive history available to assist in medical decision making as it pertains to the aforementioned surgical procedure and anticipated anesthetic course. Extensive review of available clinical information performed. [place] PMH and PSHx updated with any diagnoses/procedures that  may have been inadvertently omitted during his intake with the pre-admission testing department's nursing staff.  Clinical Discussion:  Juan Webster is a 85 y.o. male who is submitted for pre-surgical anesthesia review and clearance prior to him undergoing the above procedure. Patient has never been a smoker. 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  Patient is followed by cardiology Nehemiah Massed, MD). He was last seen in the cardiology clinic on 08/24/2020; notes reviewed.  At the time of his clinic visit, patient reported that he had been "doing super good".  Patient with fatigue associated with long distance ambulation.  He denied any chest pain, short of breath, PND, orthopnea, palpitations, peripheral edema, vertiginous symptoms, or presyncope/syncope.   Patient has a PMH significant for cardiovascular diagnoses.  Diagnostic left heart catheterization performed in 11/2006 revealed obstructive CAD.  Stents (unknown type) were placed to the LAD and RCA.  Patient suffered an NSTEMI on 07/02/2016. Cardiac catheterization at that time revealed insignificant CAD with patent stents in the LAD and RCA.  LVEF decreased at 35-45%.  There was no evidence of valvular stenosis.  Myocardial perfusion imaging study performed on 03/21/2016 revealed an LVEF of 43% and a moderate inferior perfusion abnormality consistent with previous infarct scar without evidence of myocardial ischemia.  Last TTE performed on 07/25/2019 revealed moderate left ventricular systolic dysfunction with mild LVH.  There was trivial to mild valvular insufficiency.  LVEF decreased at 30% (see full interpretation of cardiovascular testing below).  Patient on GDMT for his HTN diagnosis.  Blood pressure moderately controlled on currently prescribed beta-blocker, nitrate, and ACEi therapies.  Patient takes a daily omega-3 fatty acid capsule for ASCVD prevention; not on any other lipid-lowering therapies.  T2DM well-controlled on currently prescribed regimen; last Hgb A1c was 6.7% on 05/25/2020. Functional capacity, as defined by DASI, is documented as being >/= 4 METS.  No changes were made to patient's medication regimen.  Patient to follow-up with outpatient cardiology at defined intervals for ongoing care management.  Patient is scheduled for an elective urological procedure (hydrocelectomy vs spermatocelectomy) on 08/31/2020 with Dr. John Giovanni.  Given patient's past medical history significant for cardiovascular diagnoses, presurgical cardiac clearance was sought by the performing surgeon's office and PAT team. Per cardiology, "The patient is at the lowest risk possible for perioperative cardiovascular complications with the planned procedure.  The overall risk hisprocedure is low (<1%).   Currently has no evidence active and/or significant angina and/or congestive heart  failure. Patient may proceed to surgery without restriction or need for further cardiovascular testing and an overall LOW risk".This patient is on daily antiplatelet therapy. He has been instructed on recommendations for holding his daily clopidogrel 4 days prior to his procedure with plans to restart as soon as postoperative bleeding risk felt to be minimized by his attending surgeon. The patient has been instructed that his last dose of his anticoagulant will be on 08/26/2020.  Patient will continue daily low-dose ASA throughout the perioperative period.  Patient denies previous perioperative complications with anesthesia in the past. In review of the available records, it is noted that patient underwent a MAC anesthetic course at [facility] (ASA III) in 07/2015 without documented complications.   Vitals with BMI 08/25/2020 08/20/2020 07/28/2020  Height _0  _1  _2   Weight 190 lbs 190 lbs 193 lbs  BMI 25.07 23.76 28.31  Systolic - 517 616  Diastolic - 64 70  Pulse - 88 84    Providers/Specialists:   NOTE: Primary physician provider listed below. Patient may have been seen by APP or partner within same practice.   PROVIDER ROLE / SPECIALTY LAST Claud Kelp, MD  Urology (Surgeon) 08/20/2020  Adin Hector, MD  Primary Care Provider 07/21/2020  Serafina Royals, MD  Cardiology 08/24/2020   Allergies:  Dapagliflozin, Donepezil, and Simvastatin  Current Home Medications:   No current facility-administered medications for this encounter.    acetaminophen (TYLENOL) 500 MG tablet   albuterol (VENTOLIN HFA) 108 (90 Base) MCG/ACT inhaler   aspirin 81 MG tablet   azelastine (ASTELIN) 0.1 % nasal spray   carvedilol (COREG) 12.5 MG tablet   Choline Fenofibrate (FENOFIBRIC ACID) 135 MG CPDR   clopidogrel (PLAVIX) 75 MG tablet   folic acid (FOLVITE) 1 MG tablet   glimepiride (AMARYL) 4  MG tablet   hydrochlorothiazide (HYDRODIURIL) 25 MG tablet   hydroxychloroquine (PLAQUENIL) 200 MG tablet   isosorbide mononitrate (IMDUR) 30 MG 24 hr tablet   metFORMIN (GLUCOPHAGE) 1000 MG tablet   Omega-3 Fatty Acids (OMEGA-3 FISH OIL) 1200 MG CAPS   pantoprazole (PROTONIX) 40 MG tablet   Semaglutide,0.25 or 0.5MG/DOS, (OZEMPIC, 0.25 OR 0.5 MG/DOSE,) 2 MG/1.5ML SOPN   traMADol (ULTRAM) 50 MG tablet   donepezil (ARICEPT) 5 MG tablet   lisinopril (PRINIVIL,ZESTRIL) 20 MG tablet   History:   Past Medical History:  Diagnosis Date   Anxiety    Arthritis    B12 deficiency    CAD (coronary artery disease)    a.) stents to LAD and RCA in 11/2006   Chronic anticoagulation    Clopidogrel   Chronic kidney disease (CKD), stage III (moderate) (HCC)    DCM (dilated cardiomyopathy) (HCC)    Diverticulosis    GERD (gastroesophageal reflux disease)    Gout    Hard of hearing    History of hiatal hernia    History of kidney stones    History of second degree heart block    PPM in place   Hypertension    IBS (irritable bowel syndrome)    MRSA cellulitis 2019   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   NSTEMI (non-ST elevated myocardial infarction) ([place]) 07/02/2016   Presence of permanent cardiac pacemaker 10/17/00, 07/31/07   [employer]onic Adapta DR  ADDRL1   T2DM (type 2 diabetes mellitus) (HCC)    Thrombocytopenia (HCC)    TIA (transient ischemic attack)    Wears dentures  full upper, partial lower   Past Surgical History:  Procedure Laterality Date   CATARACT EXTRACTION W/PHACO Left 06/30/2015   Procedure: CATARACT EXTRACTION PHACO AND INTRAOCULAR LENS PLACEMENT (IOC);  Surgeon: Leandrew Koyanagi, MD;  Location: [place];  Service: Ophthalmology;  Laterality: Left;  DIABETIC - oral meds   CATARACT EXTRACTION W/PHACO Right 07/28/2015   Procedure: CATARACT EXTRACTION PHACO AND INTRAOCULAR LENS PLACEMENT (IOC) right eye;  Surgeon: Leandrew Koyanagi, MD;  Location:  [place];  Service: Ophthalmology;  Laterality: Right;  DIABETIC - oral meds TORIC   COLONOSCOPY     CORONARY ANGIOPLASTY  11/12/2006   stent - RDA   EYE SURGERY     INSERT / REPLACE / REMOVE PACEMAKER  10/17/00, 07/31/07   LEFT HEART CATH AND CORONARY ANGIOGRAPHY N/A 07/03/2016   Procedure: Left Heart Cath and Coronary Angiography;  Surgeon: Teodoro Spray, MD;  Location: [place] [facility];  Service: Cardiovascular;  Laterality: N/A;   NECK SURGERY  2015   SHOULDER SURGERY Bilateral 1990   x3   TONSILLECTOMY     No family history on file. Social History   Tobacco Use   Smoking status: Never   Smokeless tobacco: Never  Substance Use Topics   Alcohol use: No   Drug use: Never    Pertinent Clinical Results:  LABS: Labs reviewed: Acceptable for surgery.  No visits with results within 3 Day(s) from this visit.  Latest known visit with results is:  Office Visit on 08/20/2020  Component Date Value Ref Range Status   Specific Gravity, UA 08/20/2020 1.020  1.005 - 1.030 Final   pH, UA 08/20/2020 6.5  5.0 - 7.5 Final   Color, UA 08/20/2020 Yellow  Yellow Final   Appearance Ur 08/20/2020 Clear  Clear Final   Leukocytes,UA 08/20/2020 Negative  Negative Final   Protein,UA 08/20/2020 1+ (A) Negative/Trace Final   Glucose, UA 08/20/2020 Negative  Negative Final   Ketones, UA 08/20/2020 Negative  Negative Final   RBC, UA 08/20/2020 1+ (A) Negative Final   Bilirubin, UA 08/20/2020 Negative  Negative Final   Urobilinogen, Ur 08/20/2020 0.2  0.2 - 1.0 mg/dL Final   Nitrite, UA 08/20/2020 Negative  Negative Final   Microscopic Examination 08/20/2020 See below:   Final   Urine Culture, Comprehensive 08/20/2020 Preliminary report   Preliminary   Organism ID, Bacteria 08/20/2020 Comment   Preliminary   Comment: Mixed urogenital flora 25,000-50,000 colony forming units per mL    WBC, UA 08/20/2020 None seen  0 - 5 /hpf Final   RBC 08/20/2020 11-30 (A) 0 - 2 /hpf Final    Epithelial Cells (non renal) 08/20/2020 0-10  0 - 10 /hpf Final   Bacteria, UA 08/20/2020 None seen  None seen/Few Final     Ref Range & Units 3 mo ago  Glucose 70 - 110 mg/dL 146 High    Sodium 136 - 145 mmol/L 140   Potassium 3.6 - 5.1 mmol/L 4.1   Chloride 97 - 109 mmol/L 104   Carbon Dioxide (CO2) 22.0 - 32.0 mmol/L 28.5   Urea Nitrogen (BUN) 7 - 25 mg/dL 33 High    Creatinine 0.7 - 1.3 mg/dL 1.4 High    Glomerular Filtration Rate (eGFR), MDRD Estimate >60 mL/min/1.73sq m 48 Low    Calcium 8.7 - 10.3 mg/dL 9.5   AST  8 - 39 U/L 19   ALT  6 - 57 U/L 12   Alk Phos (alkaline Phosphatase) 34 - 104 U/L 52  Albumin 3.5 - 4.8 g/dL 4.2   Bilirubin, Total 0.3 - 1.2 mg/dL 0.5   Protein, Total 6.1 - 7.9 g/dL 6.4   A/G Ratio 1.0 - 5.0 gm/dL 1.9   Resulting Agency  [facility]  Specimen Collected: 05/25/20 07:52 Last Resulted: 05/25/20 10:34  Received From: [facility]  Result Received: 06/21/20 16:08    Ref Range & Units 3 mo ago  WBC (White Blood Cell Count) 4.1 - 10.2 10^3/uL 7.3   RBC (Red Blood Cell Count) 4.69 - 6.13 10^6/uL 5.04   Hemoglobin 14.1 - 18.1 gm/dL 13.4 Low    Hematocrit 40.0 - 52.0 % 42.6   MCV (Mean Corpuscular Volume) 80.0 - 100.0 fl 84.5   MCH (Mean Corpuscular Hemoglobin) 27.0 - 31.2 pg 26.6 Low    MCHC (Mean Corpuscular Hemoglobin Concentration) 32.0 - 36.0 gm/dL 31.5 Low    Platelet Count 150 - 450 10^3/uL 146 Low    RDW-CV (Red Cell Distribution Width) 11.6 - 14.8 % 14.5   MPV (Mean Platelet Volume) 9.4 - 12.4 fl 10.6   Neutrophils 1.50 - 7.80 10^3/uL 5.18   Lymphocytes 1.00 - 3.60 10^3/uL 1.32   Monocytes 0.00 - 1.50 10^3/uL 0.63   Eosinophils 0.00 - 0.55 10^3/uL 0.11   Basophils 0.00 - 0.09 10^3/uL 0.03   Neutrophil % 32.0 - 70.0 % 71.1 High    Lymphocyte % 10.0 - 50.0 % 18.1   Monocyte % 4.0 - 13.0 % 8.6   Eosinophil % 1.0 - 5.0 % 1.5   Basophil% 0.0 - 2.0 % 0.4   Immature Granulocyte % <=0.7 % 0.3   Immature  Granulocyte Count <=0.06 10^3/L 0.02   Resulting Agency  [place] - [facility]  Specimen Collected: 05/25/20 07:52 Last Resulted: 05/25/20 08:40  Received From: [facility]  Result Received: 06/21/20 16:08    ECG: Date: 08/24/2020 Rate: 87 bpm Rhythm:  AV dual paced rhythm ST segment and T wave changes: No evidence of acute ST segment elevation or depression Comparison: Similar to previous tracing obtained on 07/03/2019 NOTE: Tracing obtained at [facility]; unable for review. Above based on cardiologist's interpretation.   IMAGING / PROCEDURES: TRANSTHORACIC ECHOCARDIOGRAM performed on 07/25/2019 LVEF 30% Moderate left ventricular systolic dysfunction with mild LVH Normal right ventricular systolic function Mild AR and TR Trivial MR No PR no evidence of valvular stenosis No pericardial effusion  LEXISCAN performed on 03/21/2016 LVEF 43% Regional wall motion reveals normal myocardial thickening and wall motion No artifact noted Left ventricular cavity size normal Moderate perfusion abnormality of moderate intensity present in the inferior myocardial region on stress imaging.  Resting images show moderate inferior myocardial defect with moderate intensity consistent with previous infarct scar and/or diaphragmatic attenuation without evidence of myocardial ischemia  Impression and Plan:  Juan Webster has been referred for pre-anesthesia review and clearance prior to him undergoing the planned anesthetic and procedural courses. Available labs, pertinent testing, and imaging results were personally reviewed by me. This patient has been appropriately cleared by cardiology with an overall LOW risk of significant perioperative cardiovascular complications. Completed perioperative prescription for cardiac device management documentation completed by primary cardiology team and placed on patient's chart for review by the surgical/anesthetic team on the day of his  procedure.   Based on clinical review performed today (08/26/20), barring any significant acute changes in the patient's overall condition, it is anticipated that he will be able to proceed with the planned surgical intervention. Any  acute changes in clinical condition may necessitate his procedure being postponed and/or cancelled. Patient will meet with anesthesia team (MD and/or CRNA) on the day of his procedure for preoperative evaluation/assessment. Questions regarding anesthetic course will be fielded at that time.   Pre-surgical instructions were reviewed with the patient during his PAT appointment and questions were fielded by PAT clinical staff. Patient was advised that if any questions or concerns arise prior to his procedure then he should return a call to PAT and/or his surgeon's office to discuss.  Honor Loh, MSN, APRN, FNP-C, CEN [facility]  Peri-operative Services Nurse Practitioner Phone: [phone] Fax: [phone] 08/26/20 1:10 PM  NOTE: This note has been prepared using Dragon dictation software. Despite my best ability to proofread, there is always the potential that unintentional transcriptional errors may still occur from this process.

## 2020-08-31 ENCOUNTER — Ambulatory Visit: Payer: Medicare HMO | Admitting: Urgent Care

## 2020-08-31 ENCOUNTER — Ambulatory Visit
Admission: RE | Admit: 2020-08-31 | Discharge: 2020-08-31 | Disposition: A | Discharge status: Home / Self Care | Payer: Medicare HMO | Attending: Urology | Admitting: Urology

## 2020-08-31 ENCOUNTER — Encounter
Admission: RE | Disposition: A | Discharge status: Home / Self Care | Payer: Self-pay | Source: Home / Self Care | Attending: Urology

## 2020-08-31 DIAGNOSIS — R3129 Other microscopic hematuria: Secondary | ICD-10-CM | POA: Diagnosis not present

## 2020-08-31 DIAGNOSIS — N433 Hydrocele, unspecified: Secondary | ICD-10-CM

## 2020-08-31 DIAGNOSIS — N5082 Scrotal pain: Secondary | ICD-10-CM

## 2020-08-31 HISTORY — PX: HYDROCELE EXCISION: SHX482

## 2020-08-31 HISTORY — DX: Atherosclerotic heart disease of native coronary artery without angina pectoris: I25.10

## 2020-08-31 HISTORY — PX: CYSTOSCOPY: SHX5120

## 2020-08-31 HISTORY — DX: Dilated cardiomyopathy: I42.0

## 2020-08-31 HISTORY — DX: Gastro-esophageal reflux disease without esophagitis: K21.9

## 2020-08-31 HISTORY — DX: Chronic kidney disease, stage 3 unspecified: N18.30

## 2020-08-31 HISTORY — DX: Deficiency of other specified B group vitamins: E53.8

## 2020-08-31 HISTORY — DX: Thrombocytopenia, unspecified: D69.6

## 2020-08-31 HISTORY — DX: Long term (current) use of anticoagulants: Z79.01

## 2020-08-31 HISTORY — DX: Transient cerebral ischemic attack, unspecified: G45.9

## 2020-08-31 HISTORY — PX: SPERMATOCELECTOMY: SHX2420

## 2020-08-31 HISTORY — DX: Anxiety disorder, unspecified: F41.9

## 2020-08-31 HISTORY — DX: Personal history of other diseases of the circulatory system: Z86.79

## 2020-08-31 HISTORY — DX: Type 2 diabetes mellitus without complications: E11.9

## 2020-08-31 LAB — GLUCOSE, CAPILLARY
Glucose-Capillary: 118 mg/dL — ABNORMAL HIGH (ref 70–99)
Glucose-Capillary: 105 mg/dL — ABNORMAL HIGH (ref 70–99)

## 2020-08-31 SURGERY — HYDROCELECTOMY
Anesthesia: General

## 2020-08-31 MED ORDER — ONDANSETRON HCL 4 MG/2ML IJ SOLN: 4.0000 mg | Freq: Once | INTRAMUSCULAR | Status: DC | PRN

## 2020-08-31 MED ORDER — SULFAMETHOXAZOLE-TRIMETHOPRIM 800-160 MG PO TABS: 1.0000 | ORAL_TABLET | Freq: Two times a day (BID) | ORAL | 0 refills | Status: DC

## 2020-08-31 MED ORDER — CEFAZOLIN SODIUM-DEXTROSE 2-4 GM/100ML-% IV SOLN
INTRAVENOUS | Status: AC
  Filled 2020-08-31: qty 100

## 2020-08-31 MED ORDER — SULFAMETHOXAZOLE-TRIMETHOPRIM 800-160 MG PO TABS: 1.0000 | ORAL_TABLET | Freq: Two times a day (BID) | ORAL | 0 refills | Status: AC

## 2020-08-31 MED ORDER — ONDANSETRON HCL 4 MG/2ML IJ SOLN
INTRAMUSCULAR | Status: DC | PRN
  Administered 2020-08-31: 4 mg via INTRAVENOUS

## 2020-08-31 MED ORDER — CEFAZOLIN SODIUM-DEXTROSE 2-4 GM/100ML-% IV SOLN
2.0000 g | INTRAVENOUS | Status: AC
  Administered 2020-08-31: 2 g via INTRAVENOUS

## 2020-08-31 MED ORDER — ROCURONIUM BROMIDE 100 MG/10ML IV SOLN
INTRAVENOUS | Status: DC | PRN
  Administered 2020-08-31: 50 mg via INTRAVENOUS

## 2020-08-31 MED ORDER — HYDROCODONE-ACETAMINOPHEN 5-325 MG PO TABS: 0.5000 | ORAL_TABLET | Freq: Three times a day (TID) | ORAL | 0 refills | Status: DC | PRN

## 2020-08-31 MED ORDER — PHENYLEPHRINE HCL (PRESSORS) 10 MG/ML IV SOLN
INTRAVENOUS | Status: DC | PRN
  Administered 2020-08-31 (×2): 100 ug via INTRAVENOUS

## 2020-08-31 MED ORDER — BUPIVACAINE HCL (PF) 0.25 % IJ SOLN
INTRAMUSCULAR | Status: AC
  Filled 2020-08-31: qty 30

## 2020-08-31 MED ORDER — CHLORHEXIDINE GLUCONATE 0.12 % MT SOLN
15.0000 mL | Freq: Once | OROMUCOSAL | Status: AC
  Administered 2020-08-31: 15 mL via OROMUCOSAL

## 2020-08-31 MED ORDER — SODIUM CHLORIDE 0.9 % IV SOLN
INTRAVENOUS | Status: DC | PRN
  Administered 2020-08-31: 25 ug/min via INTRAVENOUS

## 2020-08-31 MED ORDER — GLYCOPYRROLATE 0.2 MG/ML IJ SOLN
INTRAMUSCULAR | Status: DC | PRN
  Administered 2020-08-31: .2 mg via INTRAVENOUS

## 2020-08-31 MED ORDER — BUPIVACAINE HCL (PF) 0.5 % IJ SOLN
INTRAMUSCULAR | Status: AC
  Filled 2020-08-31: qty 30

## 2020-08-31 MED ORDER — BACITRACIN ZINC 500 UNIT/GM EX OINT
TOPICAL_OINTMENT | CUTANEOUS | Status: AC
  Filled 2020-08-31: qty 28.35

## 2020-08-31 MED ORDER — BUPIVACAINE HCL 0.25 % IJ SOLN
INTRAMUSCULAR | Status: DC | PRN
  Administered 2020-08-31: 4 mL

## 2020-08-31 MED ORDER — SUGAMMADEX SODIUM 500 MG/5ML IV SOLN
INTRAVENOUS | Status: DC | PRN
  Administered 2020-08-31: 300 mg via INTRAVENOUS

## 2020-08-31 MED ORDER — MEPERIDINE HCL 25 MG/ML IJ SOLN: 6.2500 mg | INTRAMUSCULAR | Status: DC | PRN

## 2020-08-31 MED ORDER — FENTANYL CITRATE (PF) 100 MCG/2ML IJ SOLN: 25.0000 ug | INTRAMUSCULAR | Status: DC | PRN

## 2020-08-31 MED ORDER — CHLORHEXIDINE GLUCONATE 0.12 % MT SOLN
OROMUCOSAL | Status: AC
  Filled 2020-08-31: qty 15

## 2020-08-31 MED ORDER — ACETAMINOPHEN 10 MG/ML IV SOLN
INTRAVENOUS | Status: AC
  Filled 2020-08-31: qty 100

## 2020-08-31 MED ORDER — ACETAMINOPHEN 10 MG/ML IV SOLN
INTRAVENOUS | Status: DC | PRN
  Administered 2020-08-31: 1000 mg via INTRAVENOUS

## 2020-08-31 MED ORDER — FENTANYL CITRATE (PF) 100 MCG/2ML IJ SOLN
INTRAMUSCULAR | Status: AC
  Filled 2020-08-31: qty 2

## 2020-08-31 MED ORDER — SODIUM CHLORIDE 0.9 % IV SOLN: INTRAVENOUS | Status: DC

## 2020-08-31 MED ORDER — FENTANYL CITRATE (PF) 100 MCG/2ML IJ SOLN
INTRAMUSCULAR | Status: DC | PRN
  Administered 2020-08-31: 50 ug via INTRAVENOUS

## 2020-08-31 MED ORDER — DEXAMETHASONE SODIUM PHOSPHATE 10 MG/ML IJ SOLN
INTRAMUSCULAR | Status: DC | PRN
  Administered 2020-08-31: 5 mg via INTRAVENOUS

## 2020-08-31 MED ORDER — BACITRACIN 500 UNIT/GM EX OINT
TOPICAL_OINTMENT | CUTANEOUS | Status: DC | PRN
  Administered 2020-08-31: 1 via TOPICAL

## 2020-08-31 MED ORDER — LIDOCAINE HCL (CARDIAC) PF 100 MG/5ML IV SOSY
PREFILLED_SYRINGE | INTRAVENOUS | Status: DC | PRN
  Administered 2020-08-31: 80 mg via INTRAVENOUS

## 2020-08-31 MED ORDER — PROPOFOL 10 MG/ML IV BOLUS
INTRAVENOUS | Status: DC | PRN
  Administered 2020-08-31: 120 mg via INTRAVENOUS

## 2020-08-31 MED ORDER — ORAL CARE MOUTH RINSE: 15.0000 mL | Freq: Once | OROMUCOSAL | Status: AC

## 2020-08-31 SURGICAL SUPPLY — 53 items
BAG DRAIN CYSTO-URO LG1000N (MISCELLANEOUS) ×4 IMPLANT
BLADE CLIPPER SURG (BLADE) ×4 IMPLANT
BLADE SURG 15 STRL LF DISP TIS (BLADE) ×2 IMPLANT
BLADE SURG 15 STRL SS (BLADE) ×2
CANISTER SUCT 1200ML W/VALVE (MISCELLANEOUS) ×4 IMPLANT
CHLORAPREP W/TINT 26 (MISCELLANEOUS) ×4 IMPLANT
COVER WAND RF STERILE (DRAPES) ×4 IMPLANT
DRAIN PENROSE 0.625X18 (DRAIN) ×4 IMPLANT
DRAIN PENROSE 12X.25 LTX STRL (MISCELLANEOUS) ×4 IMPLANT
DRAPE LAPAROTOMY 77X122 PED (DRAPES) ×4 IMPLANT
DRSG GAUZE FLUFF 36X18 (GAUZE/BANDAGES/DRESSINGS) ×4 IMPLANT
DRSG TELFA 3X8 NADH (GAUZE/BANDAGES/DRESSINGS) ×4 IMPLANT
ELECT CAUTERY BLADE 6.4 (BLADE) ×4 IMPLANT
ELECT CAUTERY NEEDLE TIP 1.0 (MISCELLANEOUS) ×4
ELECT REM PT RETURN 9FT ADLT (ELECTROSURGICAL) ×4
ELECTRODE CAUTERY NEDL TIP 1.0 (MISCELLANEOUS) ×2 IMPLANT
ELECTRODE REM PT RTRN 9FT ADLT (ELECTROSURGICAL) ×2 IMPLANT
GAUZE SPONGE 4X4 12PLY STRL (GAUZE/BANDAGES/DRESSINGS) ×4 IMPLANT
GLOVE SURG UNDER POLY LF SZ7.5 (GLOVE) ×4 IMPLANT
GOWN STRL REUS W/ TWL LRG LVL3 (GOWN DISPOSABLE) ×2 IMPLANT
GOWN STRL REUS W/ TWL XL LVL3 (GOWN DISPOSABLE) ×2 IMPLANT
GOWN STRL REUS W/TWL LRG LVL3 (GOWN DISPOSABLE) ×2
GOWN STRL REUS W/TWL XL LVL3 (GOWN DISPOSABLE) ×2
GOWN STRL REUS W/TWL XL LVL4 (GOWN DISPOSABLE) ×4 IMPLANT
KIT TURNOVER CYSTO (KITS) ×4 IMPLANT
KIT TURNOVER KIT A (KITS) ×4 IMPLANT
LABEL OR SOLS (LABEL) ×4 IMPLANT
MANIFOLD NEPTUNE II (INSTRUMENTS) ×4 IMPLANT
NEEDLE HYPO 25X1 1.5 SAFETY (NEEDLE) ×4 IMPLANT
NS IRRIG 1000ML POUR BTL (IV SOLUTION) ×4 IMPLANT
NS IRRIG 500ML POUR BTL (IV SOLUTION) ×4 IMPLANT
PACK BASIN MINOR ARMC (MISCELLANEOUS) ×4 IMPLANT
PACK CYSTO AR (MISCELLANEOUS) ×4 IMPLANT
SET CYSTO W/LG BORE CLAMP LF (SET/KITS/TRAYS/PACK) ×4 IMPLANT
SUPPORETR ATHLETIC LG (MISCELLANEOUS) ×2 IMPLANT
SUPPORTER ATHLETIC LG (MISCELLANEOUS) ×4
SURGILUBE 2OZ TUBE FLIPTOP (MISCELLANEOUS) ×4 IMPLANT
SUT CHROMIC 3 0 PS 2 (SUTURE) ×4 IMPLANT
SUT CHROMIC 3 0 SH 27 (SUTURE) ×8 IMPLANT
SUT ETHILON 3-0 FS-10 30 BLK (SUTURE) ×4
SUT ETHILON NAB PS2 4-0 18IN (SUTURE) ×4 IMPLANT
SUT MNCRL 3-0 UNDYED SH (SUTURE) ×2 IMPLANT
SUT MONOCRYL 3-0 UNDYED (SUTURE) ×2
SUT SILK 0 CT 1 30 (SUTURE) ×4 IMPLANT
SUT VIC AB 3-0 SH 27 (SUTURE) ×4
SUT VIC AB 3-0 SH 27X BRD (SUTURE) ×4 IMPLANT
SUTURE EHLN 3-0 FS-10 30 BLK (SUTURE) ×2 IMPLANT
SYR 10ML LL (SYRINGE) ×4 IMPLANT
SYR BULB IRRIG 60ML STRL (SYRINGE) ×4 IMPLANT
SYR TOOMEY IRRIG 70ML (MISCELLANEOUS) ×4
SYRINGE TOOMEY IRRIG 70ML (MISCELLANEOUS) ×2 IMPLANT
WATER STERILE IRR 1000ML POUR (IV SOLUTION) ×4 IMPLANT
WATER STERILE IRR 3000ML UROMA (IV SOLUTION) ×4 IMPLANT

## 2020-08-31 NOTE — Transfer of Care (Signed)
Immediate Anesthesia Transfer of Care Note  Patient: Juan Webster  Procedure(s) Performed: HYDROCELECTOMY ADULT VS. SPERMATOCELECTOMY (Left) CYSTOSCOPY/FLEXIBLE SPERMATOCELECTOMY  Patient Location: PACU  Anesthesia Type:General  Level of Consciousness: awake, drowsy and patient cooperative  Airway & Oxygen Therapy: Patient Spontanous Breathing and Patient connected to face mask oxygen  Post-op Assessment: Report given to RN and Post -op Vital signs reviewed and stable  Post vital signs: Reviewed and stable  Last Vitals:  Vitals Value Taken Time  BP 135/67 08/31/20 1245  Temp 36.6 C 08/31/20 1244  Pulse 78 08/31/20 1250  Resp 17 08/31/20 1249  SpO2 97 % 08/31/20 1250  Vitals shown include unvalidated device data.  Last Pain:  Vitals:   08/31/20 1244  PainSc: Asleep         Complications: No notable events documented.

## 2020-08-31 NOTE — Anesthesia Preprocedure Evaluation (Signed)
Anesthesia Evaluation  Patient identified by MRN, date of birth, ID band Patient awake    Reviewed: Allergy & Precautions, NPO status , Patient's Chart, lab work & pertinent test results, reviewed documented beta blocker date and time   Airway Mallampati: II  TM Distance: >3 FB Neck ROM: Full    Dental  (+) Partial Lower, Upper Dentures   Pulmonary neg pulmonary ROS,    Pulmonary exam normal        Cardiovascular hypertension, Pt. on medications and Pt. on home beta blockers + CAD, + Past MI and + Cardiac Stents  Normal cardiovascular exam+ dysrhythmias + pacemaker      Neuro/Psych Anxiety TIA Neuromuscular disease    GI/Hepatic Neg liver ROS, hiatal hernia, GERD  ,  Endo/Other  diabetes, Type 2, Oral Hypoglycemic Agents  Renal/GU Renal disease     Musculoskeletal  (+) Arthritis , Osteoarthritis,    Abdominal   Peds  Hematology negative hematology ROS (+)   Anesthesia Other Findings . Anxiety  . Arthritis  . B12 deficiency 07/02/2017  By labs 4/19  . CKD (chronic kidney disease) stage 3, GFR 30-59 ml/min (CMS-HCC) 03/16/2014  . Coronary artery disease  RCA stent 9/08  . Diabetes mellitus type 2, uncomplicated (CMS-HCC)  . Diverticulosis  . GERD (gastroesophageal reflux disease)  . H/O TIA (transient ischemic attack) and stroke  . Hypertension  . IBS (irritable bowel syndrome)  . MRSA cellulitis 11/2017  . MRSA cellulitis 11/2017  . Osteoarthritis GWK  a. Cervical spine. b. Lumbar spine with spinal stenosis s/p epidurals. c. Hands.  . Renal stones  . Thrombocytopenia (CMS-HCC)  . Urinary obstruction  . Ventral hernia   Reproductive/Obstetrics                             Anesthesia Physical  Anesthesia Plan  ASA: 3  Anesthesia Plan: General   Post-op Pain Management:    Induction: Intravenous  PONV Risk Score and Plan: 2 and Midazolam  Airway Management Planned: LMA and  Oral ETT  Additional Equipment:   Intra-op Plan:   Post-operative Plan:   Informed Consent: I have reviewed the patients History and Physical, chart, labs and discussed the procedure including the risks, benefits and alternatives for the proposed anesthesia with the patient or authorized representative who has indicated his/her understanding and acceptance.       Plan Discussed with: CRNA, Anesthesiologist and Surgeon  Anesthesia Plan Comments:         Anesthesia Quick Evaluation

## 2020-08-31 NOTE — Anesthesia Postprocedure Evaluation (Signed)
Anesthesia Post Note  Patient: Juan Webster  Procedure(s) Performed: HYDROCELECTOMY ADULT VS. SPERMATOCELECTOMY (Left) CYSTOSCOPY/FLEXIBLE SPERMATOCELECTOMY  Patient location during evaluation: PACU Anesthesia Type: General Level of consciousness: awake and alert, awake and oriented Pain management: pain level controlled Vital Signs Assessment: post-procedure vital signs reviewed and stable Respiratory status: spontaneous breathing, nonlabored ventilation and respiratory function stable Cardiovascular status: blood pressure returned to baseline and stable Postop Assessment: no apparent nausea or vomiting Anesthetic complications: no   No notable events documented.   Last Vitals:  Vitals:   08/31/20 1315 08/31/20 1332  BP: 129/62 (!) 144/69  Pulse: 75 81  Resp: 13 16  Temp:  (!) 36.1 C  SpO2: 98% 100%    Last Pain:  Vitals:   08/31/20 1332  TempSrc: Temporal  PainSc:                  Manfred Arch

## 2020-08-31 NOTE — Progress Notes (Signed)
Informed Dr. Lonna Cobb of family's request to talk with him today - Please call patient's daughter Colin Rhein regarding patient's procedure.  She stated "No one talked to or with the family after the procedure". [phone]

## 2020-08-31 NOTE — Discharge Instructions (Addendum)
Discharge instructions following scrotal surgery  Call your doctor for: Fever is greater than 100.5 Severe nausea or vomiting Increasing pain not controlled by pain medication Increasing redness or drainage from incisions  The number for questions or concerns is [phone]  Activity level: No lifting greater than 10 pounds (about equal to gallon of milk) for the next 2 weeks or until cleared to do so at follow-up appointment.  Otherwise activity as tolerated by comfort level.  Diet: May resume your regular diet as tolerated  Driving: No driving while still taking opiate pain medications (weight at least 6-8 hours after last dose).  No driving if you still sore from surgery as it may limit her ability to react quickly if necessary.   Shower/bath: May shower 48 hours after surgery.  No tub bath, hot tub or swimming for 10 days.  Wound care: He may cover wounds with sterile gauze as needed to prevent incisions rubbing on close follow-up in any seepage.  Where tight fitting underpants for at least 2 weeks.  He should apply cold compresses (ice or sac of frozen peas/corn) to your scrotum for at least 48 hours to reduce the swelling.  You should expect that his scrotum will swell up initially and then get smaller over the next 2-4 weeks.  Medications: You may resume your at home medications. Do not resume Plavix for 24 hours Prescriptions for pain medication and antibiotic (for 3 days) were sent to your pharmacy  Follow-up appointments: Follow-up appointment will be scheduled with Dr. Lonna Cobb in 4 weeks for a wound check.   AMBULATORY SURGERY  DISCHARGE INSTRUCTIONS   The drugs that you were given will stay in your system until tomorrow so for the next 24 hours you should not:  Drive an automobile Make any legal decisions Drink any alcoholic beverage   You may resume regular meals tomorrow.  Today it is better to start with liquids and gradually work up to solid  foods.  You may eat anything you prefer, but it is better to start with liquids, then soup and crackers, and gradually work up to solid foods.   Please notify your doctor immediately if you have any unusual bleeding, trouble breathing, redness and pain at the surgery site, drainage, fever, or pain not relieved by medication.    Please contact your physician with any problems or Same Day Surgery at [phone], Monday through Friday 6 am to 4 pm, or [place] at [facility] number at [phone].

## 2020-08-31 NOTE — Interval H&P Note (Signed)
History and Physical Interval Note:  08/31/2020 11:06 AM  Juan Webster  has presented today for surgery, with the diagnosis of left scrotal mass,microhematuria.  The various methods of treatment have been discussed with the patient and family. After consideration of risks, benefits and other options for treatment, the patient has consented to  Procedure(s): HYDROCELECTOMY ADULT VS. SPERMATOCELECTOMY (Left) CYSTOSCOPY/FLEXIBLE (N/A) SPERMATOCELECTOMY (N/A) as a surgical intervention.  The patient's history has been reviewed, patient examined, no change in status, stable for surgery.  I have reviewed the patient's chart and labs.  Questions were answered to the patient's satisfaction.     Albert Hersch C Carmisha Larusso

## 2020-08-31 NOTE — Op Note (Signed)
Preoperative diagnosis:  Left hydrocele versus spermatocele Microhematuria  Postoperative diagnosis:  Left hydrocele spermatic cord Microhematuria  Procedure: Excision hydrocele of spermatic cord Cystoscopy  Surgeon: Riki Altes, MD  Anesthesia: General  Complications: None  Intraoperative findings:  Cystic structure posterior to testis and separate from epididymis consistent with a cord hydrocele Cystoscopy-urethra normal in caliber without stricture, moderate lateral lobe enlargement with hypervascularity and friability.  Bladder mucosa without erythema, solid or papillary lesions.  Ureteral orifices normal-appearing bilaterally with clear efflux.  Mild bladder trabeculation present.  EBL: Minimal  Specimens: Portions of hydrocele sac  Indication: Juan Webster is a 85 y.o. male with chronic left hemiscrotal pain.  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  Has been treated for recurrent epididymitis by PCP without improvement.  He has requested excision and understands this may not resolve his pain.  He also was noted to have microhematuria with negative upper tracts on imaging and cystoscopy will also be performed.  After reviewing the management options for treatment, he elected to proceed with the above surgical procedure(s). We have discussed the potential benefits and risks of the procedure, side effects of the proposed treatment, the likelihood of the patient achieving the goals of the procedure, and any potential problems that might occur during the procedure or recuperation. Informed consent has been obtained.  Description of procedure:  The patient was taken to the operating room and general anesthesia was induced.  The patient was placed in the dorsal lithotomy position, prepped and draped in the usual sterile fashion, and preoperative antibiotics were administered. A preoperative time-out was  performed.   A 21 French cystoscope sheath was lubricated, passed per urethra and advanced proximally into the bladder with findings as described above.  The bladder was emptied and cystoscope was removed.  Attention was then directed to the left hemiscrotum and the skin was marked in the midportion transversely.  The testis was elevated to this marking and the skin was incised and further opened with cautery.  The tunica vaginalis was identified and was able to be easily separated from scrotal walls and delivered into the operative field.  The tunica vaginalis was elevated and incised with a minimal amount of fluid.  Tunica vaginalis was further opened and the testis was normal in appearance.  There were small epididymal cysts present which were nonsignificant.  There was a cystic structure posterior to the testis and epididymis as described above.  This was opened and < 50 cc of clear fluid was aspirated.  The sac was further opened taking care to avoid the vas deferens.  The sac edges were excised and the remaining portions were then imbricated with 3-0 chromic suture.  Hemostasis was obtained with cautery and was noted to be excellent.  The testis and cord were irrigated and placed back into the left hemiscrotum in the anatomic position.  The left hemiscrotum was then irrigated.  Due to excellent hemostasis it was elected not to place a drain.  Skin edges were anesthetized with 4 mL of quarter percent plain Sensorcaine.  The dartos was closed with a running 3-0 chromic suture and the skin was closed with a 3-0 chromic horizontal mattress suture.  A dressing of antibiotic ointment, Telfa, fluffs and scrotal support was applied.  After anesthetic reversal he was transported to the PACU in stable condition.   Plan: Postop follow-up 1 month   Riki Altes, M.D.

## 2020-08-31 NOTE — Anesthesia Procedure Notes (Signed)
Procedure Name: Intubation Date/Time: 08/31/2020 11:31 AM Performed by: Mohammed Kindle, CRNA Pre-anesthesia Checklist: Patient identified, Emergency Drugs available, Suction available and Patient being monitored Patient Re-evaluated:Patient Re-evaluated prior to induction Oxygen Delivery Method: Circle system utilized Preoxygenation: Pre-oxygenation with 100% oxygen Induction Type: IV induction Ventilation: Mask ventilation without difficulty Laryngoscope Size: McGraph and 3 Grade View: Grade I Tube type: Oral Tube size: 7.0 mm Number of attempts: 1 Airway Equipment and Method: Stylet and Oral airway Placement Confirmation: ETT inserted through vocal cords under direct vision, positive ETCO2, breath sounds checked- equal and bilateral and CO2 detector Secured at: 21 cm Tube secured with: Tape Dental Injury: Teeth and Oropharynx as per pre-operative assessment

## 2020-09-01 ENCOUNTER — Encounter: Payer: Self-pay | Admitting: Urology

## 2020-09-01 LAB — SURGICAL PATHOLOGY

## 2020-10-11 ENCOUNTER — Other Ambulatory Visit: Payer: Self-pay | Admitting: Gastroenterology

## 2020-10-11 DIAGNOSIS — R110 Nausea: Secondary | ICD-10-CM

## 2020-10-11 DIAGNOSIS — R1084 Generalized abdominal pain: Secondary | ICD-10-CM

## 2020-10-11 DIAGNOSIS — R6881 Early satiety: Secondary | ICD-10-CM

## 2020-11-26 ENCOUNTER — Ambulatory Visit
Admission: RE | Admit: 2020-11-26 | Discharge: 2020-11-26 | Disposition: A | Discharge status: Home / Self Care | Payer: Medicare HMO | Source: Ambulatory Visit | Attending: Gastroenterology | Admitting: Gastroenterology

## 2020-11-26 ENCOUNTER — Other Ambulatory Visit: Payer: Self-pay

## 2020-11-26 DIAGNOSIS — R1084 Generalized abdominal pain: Secondary | ICD-10-CM | POA: Insufficient documentation

## 2020-11-26 DIAGNOSIS — R110 Nausea: Secondary | ICD-10-CM | POA: Diagnosis present

## 2020-11-26 DIAGNOSIS — R6881 Early satiety: Secondary | ICD-10-CM | POA: Diagnosis present

## 2020-11-26 MED ORDER — TECHNETIUM TC 99M SULFUR COLLOID
2.4200 | Freq: Once | INTRAVENOUS | Status: AC | PRN
  Administered 2020-11-26: 2.42 via INTRAVENOUS

## 2020-12-07 ENCOUNTER — Encounter: Payer: Self-pay | Admitting: Internal Medicine

## 2020-12-08 ENCOUNTER — Ambulatory Visit: Payer: Medicare HMO | Admitting: Registered Nurse

## 2020-12-08 ENCOUNTER — Encounter
Admission: RE | Disposition: A | Discharge status: Home / Self Care | Payer: Self-pay | Source: Ambulatory Visit | Attending: Internal Medicine

## 2020-12-08 ENCOUNTER — Ambulatory Visit
Admission: RE | Admit: 2020-12-08 | Discharge: 2020-12-08 | Disposition: A | Discharge status: Home / Self Care | Payer: Medicare HMO | Source: Ambulatory Visit | Attending: Internal Medicine | Admitting: Internal Medicine

## 2020-12-08 DIAGNOSIS — Z7901 Long term (current) use of anticoagulants: Secondary | ICD-10-CM | POA: Insufficient documentation

## 2020-12-08 DIAGNOSIS — Z950 Presence of cardiac pacemaker: Secondary | ICD-10-CM | POA: Diagnosis not present

## 2020-12-08 DIAGNOSIS — N183 Chronic kidney disease, stage 3 unspecified: Secondary | ICD-10-CM | POA: Insufficient documentation

## 2020-12-08 DIAGNOSIS — E1122 Type 2 diabetes mellitus with diabetic chronic kidney disease: Secondary | ICD-10-CM | POA: Diagnosis not present

## 2020-12-08 DIAGNOSIS — Z7902 Long term (current) use of antithrombotics/antiplatelets: Secondary | ICD-10-CM | POA: Diagnosis not present

## 2020-12-08 DIAGNOSIS — Z955 Presence of coronary angioplasty implant and graft: Secondary | ICD-10-CM | POA: Diagnosis not present

## 2020-12-08 DIAGNOSIS — K2960 Other gastritis without bleeding: Secondary | ICD-10-CM | POA: Diagnosis not present

## 2020-12-08 DIAGNOSIS — K219 Gastro-esophageal reflux disease without esophagitis: Secondary | ICD-10-CM | POA: Insufficient documentation

## 2020-12-08 DIAGNOSIS — Z7984 Long term (current) use of oral hypoglycemic drugs: Secondary | ICD-10-CM | POA: Diagnosis not present

## 2020-12-08 DIAGNOSIS — Z888 Allergy status to other drugs, medicaments and biological substances status: Secondary | ICD-10-CM | POA: Insufficient documentation

## 2020-12-08 DIAGNOSIS — K449 Diaphragmatic hernia without obstruction or gangrene: Secondary | ICD-10-CM | POA: Diagnosis not present

## 2020-12-08 DIAGNOSIS — K5710 Diverticulosis of small intestine without perforation or abscess without bleeding: Secondary | ICD-10-CM | POA: Diagnosis not present

## 2020-12-08 DIAGNOSIS — Z7982 Long term (current) use of aspirin: Secondary | ICD-10-CM | POA: Insufficient documentation

## 2020-12-08 DIAGNOSIS — Z79899 Other long term (current) drug therapy: Secondary | ICD-10-CM | POA: Diagnosis not present

## 2020-12-08 DIAGNOSIS — K222 Esophageal obstruction: Secondary | ICD-10-CM | POA: Insufficient documentation

## 2020-12-08 DIAGNOSIS — I129 Hypertensive chronic kidney disease with stage 1 through stage 4 chronic kidney disease, or unspecified chronic kidney disease: Secondary | ICD-10-CM | POA: Insufficient documentation

## 2020-12-08 DIAGNOSIS — R1013 Epigastric pain: Secondary | ICD-10-CM | POA: Insufficient documentation

## 2020-12-08 DIAGNOSIS — E1140 Type 2 diabetes mellitus with diabetic neuropathy, unspecified: Secondary | ICD-10-CM | POA: Insufficient documentation

## 2020-12-08 HISTORY — PX: ESOPHAGOGASTRODUODENOSCOPY: SHX5428

## 2020-12-08 LAB — GLUCOSE, CAPILLARY: Glucose-Capillary: 67 mg/dL — ABNORMAL LOW (ref 70–99)

## 2020-12-08 SURGERY — EGD (ESOPHAGOGASTRODUODENOSCOPY)
Anesthesia: General

## 2020-12-08 MED ORDER — DEXTROSE 50 % IV SOLN
12.5000 g | Freq: Once | INTRAVENOUS | Status: AC
  Administered 2020-12-08: 12.5 g via INTRAVENOUS

## 2020-12-08 MED ORDER — PROPOFOL 10 MG/ML IV BOLUS
INTRAVENOUS | Status: DC | PRN
  Administered 2020-12-08: 60 mg via INTRAVENOUS

## 2020-12-08 MED ORDER — SODIUM CHLORIDE 0.9 % IV SOLN: INTRAVENOUS | Status: DC

## 2020-12-08 MED ORDER — DEXTROSE 50 % IV SOLN
INTRAVENOUS | Status: AC
  Filled 2020-12-08: qty 50

## 2020-12-08 MED ORDER — PROPOFOL 500 MG/50ML IV EMUL
INTRAVENOUS | Status: DC | PRN
  Administered 2020-12-08: 100 ug/kg/min via INTRAVENOUS

## 2020-12-08 NOTE — Transfer of Care (Signed)
Immediate Anesthesia Transfer of Care Note  Patient: Juan Webster  Procedure(s) Performed: ESOPHAGOGASTRODUODENOSCOPY (EGD)  Patient Location: PACU  Anesthesia Type:General  Level of Consciousness: drowsy  Airway & Oxygen Therapy: Patient Spontanous Breathing and Patient connected to nasal cannula oxygen  Post-op Assessment: Report given to RN and Post -op Vital signs reviewed and stable  Post vital signs: Reviewed and stable  Last Vitals:  Vitals Value Taken Time  BP    Temp    Pulse    Resp    SpO2      Last Pain:  Vitals:   12/08/20 1433  TempSrc: Temporal         Complications: No notable events documented.

## 2020-12-08 NOTE — Anesthesia Preprocedure Evaluation (Signed)
Anesthesia Evaluation  Patient identified by MRN, date of birth, ID band Patient awake    Reviewed: Allergy & Precautions, NPO status , Patient's Chart, lab work & pertinent test results  History of Anesthesia Complications Negative for: history of anesthetic complications  Airway Mallampati: III  TM Distance: >3 FB Neck ROM: full    Dental  (+) Chipped, Missing, Poor Dentition   Pulmonary neg shortness of breath,    Pulmonary exam normal        Cardiovascular Exercise Tolerance: Good hypertension, (-) angina+ CAD, + Past MI and + Cardiac Stents  Normal cardiovascular exam+ pacemaker      Neuro/Psych PSYCHIATRIC DISORDERS TIA Neuromuscular disease    GI/Hepatic Neg liver ROS, hiatal hernia, GERD  Medicated and Controlled,  Endo/Other  diabetes, Type 2  Renal/GU Renal disease  negative genitourinary   Musculoskeletal  (+) Arthritis ,   Abdominal   Peds  Hematology negative hematology ROS (+)   Anesthesia Other Findings Past Medical History: No date: Anxiety No date: Arthritis No date: B12 deficiency No date: CAD (coronary artery disease)     Comment:  a.) stents to LAD and RCA in 11/2006 No date: Chronic anticoagulation     Comment:  Clopidogrel No date: Chronic kidney disease (CKD), stage III (moderate) (HCC) No date: DCM (dilated cardiomyopathy) (HCC) No date: Diverticulosis No date: GERD (gastroesophageal reflux disease) No date: Gout No date: Hard of hearing No date: History of hiatal hernia No date: History of kidney stones No date: History of second degree heart block     Comment:  PPM in place No date: Hypertension No date: IBS (irritable bowel syndrome) 2019: MRSA cellulitis No date: Neuropathy of both feet     Comment:  secondary to DM 07/02/2016: NSTEMI (non-ST elevated myocardial infarction) (HCC) 10/17/00, 07/31/07: Presence of permanent cardiac pacemaker     Comment:  [employer]tronic Adapta DR   ADDRL1 No date: T2DM (type 2 diabetes mellitus) (HCC) No date: Thrombocytopenia (HCC) No date: TIA (transient ischemic attack) No date: Wears dentures     Comment:  full upper, partial lower  Past Surgical History: 06/30/2015: CATARACT EXTRACTION W/PHACO; Left     Comment:  Procedure: CATARACT EXTRACTION PHACO AND INTRAOCULAR               LENS PLACEMENT (IOC);  Surgeon: Lockie Mola, MD;               Location: [facility];  Service: Ophthalmology;                Laterality: Left;  DIABETIC - oral meds 07/28/2015: CATARACT EXTRACTION W/PHACO; Right     Comment:  Procedure: CATARACT EXTRACTION PHACO AND INTRAOCULAR               LENS PLACEMENT (IOC) right eye;  Surgeon: Lockie Mola, MD;  Location: [facility];  Service:              Ophthalmology;  Laterality: Right;  DIABETIC - oral               meds TORIC No date: COLONOSCOPY 11/12/2006: CORONARY ANGIOPLASTY     Comment:  stent - RDA 08/31/2020: CYSTOSCOPY; N/A     Comment:  Procedure: CYSTOSCOPY/FLEXIBLE;  Surgeon: Riki Altes, MD;  Location: [facility] ORS;  Service: Urology;  Laterality: N/A; No date: EYE SURGERY 08/31/2020: HYDROCELE EXCISION; Left     Comment:  Procedure: HYDROCELECTOMY ADULT VS. SPERMATOCELECTOMY;                Surgeon: Riki Altes, MD;  Location: [facility] ORS;                Service: Urology;  Laterality: Left; 10/17/00, 07/31/07: INSERT / REPLACE / REMOVE PACEMAKER 07/03/2016: LEFT HEART CATH AND CORONARY ANGIOGRAPHY; N/A     Comment:  Procedure: Left Heart Cath and Coronary Angiography;                Surgeon: Dalia Heading, MD;  Location: [facility] INVASIVE CV               LAB;  Service: Cardiovascular;  Laterality: N/A; 2015: NECK SURGERY 1990: SHOULDER SURGERY; Bilateral     Comment:  x3 08/31/2020: SPERMATOCELECTOMY; N/A     Comment:  Procedure: SPERMATOCELECTOMY;  Surgeon: Riki Altes, MD;  Location: [facility]  ORS;  Service: Urology;                Laterality: N/A; No date: TONSILLECTOMY  BMI    Body Mass Index: 25.77 kg/m      Reproductive/Obstetrics negative OB ROS                             Anesthesia Physical Anesthesia Plan  ASA: 4  Anesthesia Plan: General   Post-op Pain Management:    Induction: Intravenous  PONV Risk Score and Plan: Propofol infusion and TIVA  Airway Management Planned: Natural Airway and Nasal Cannula  Additional Equipment:   Intra-op Plan:   Post-operative Plan:   Informed Consent: I have reviewed the patients History and Physical, chart, labs and discussed the procedure including the risks, benefits and alternatives for the proposed anesthesia with the patient or authorized representative who has indicated his/her understanding and acceptance.     Dental Advisory Given  Plan Discussed with: Anesthesiologist, CRNA and Surgeon  Anesthesia Plan Comments: (Patient consented for risks of anesthesia including but not limited to:  - adverse reactions to medications - risk of airway placement if required - damage to eyes, teeth, lips or other oral mucosa - nerve damage due to positioning  - sore throat or hoarseness - Damage to heart, brain, nerves, lungs, other parts of body or loss of life  Patient voiced understanding.)        Anesthesia Quick Evaluation

## 2020-12-08 NOTE — Op Note (Signed)
[facility] Gastroenterology Patient Name: Juan Webster Procedure Date: 12/08/2020 2:30 PM MRN: [id number] Account #: [id number] Date of Birth: Dec 24, 1935 Admit Type: Outpatient Age: 85 Room: [facility] ENDO ROOM 2 Gender: Male Note Status: Finalized Instrument Name: Laurette Schimke [id number] Procedure:             Upper GI endoscopy Indications:           Epigastric abdominal pain, Early satiety, Nausea Providers:             Boykin Nearing. Norma Fredrickson MD, MD Referring MD:          Juan Nones, MD (Referring MD) Medicines:             Propofol per Anesthesia Complications:         No immediate complications. Procedure:             Pre-Anesthesia Assessment:                        - The risks and benefits of the procedure and the                         sedation options and risks were discussed with the                         patient. All questions were answered and informed                         consent was obtained.                        - Patient identification and proposed procedure were                         verified prior to the procedure by the nurse. The                         procedure was verified in the procedure room.                        - ASA Grade Assessment: III - A patient with severe                         systemic disease.                        - After reviewing the risks and benefits, the patient                         was deemed in satisfactory condition to undergo the                         procedure.                        After obtaining informed consent, the endoscope was                         passed under direct vision. Throughout the procedure,  the patient's blood pressure, pulse, and oxygen                         saturations were monitored continuously. The Endoscope                         was introduced through the mouth, and advanced to the                         third part of duodenum. The upper GI endoscopy was                          accomplished without difficulty. The patient tolerated                         the procedure well. Findings:      One benign-appearing, intrinsic mild stenosis was found at the       gastroesophageal junction. This stenosis measured 1.5 cm (inner       diameter) x less than one cm (in length). The stenosis was traversed.      A 1 cm hiatal hernia was present.      Patchy minimal inflammation characterized by erosions and erythema was       found in the gastric antrum. Biopsies were taken with a cold forceps for       Helicobacter pylori testing.      A 7 mm non-bleeding diverticulum was found in the duodenal bulb.      The second portion of the duodenum and third portion of the duodenum       were normal.      The exam was otherwise without abnormality. Impression:            - Benign-appearing esophageal stenosis.                        - 1 cm hiatal hernia.                        - Gastritis. Biopsied.                        - Non-bleeding duodenal diverticulum.                        - Normal second portion of the duodenum and third                         portion of the duodenum.                        - The examination was otherwise normal. Recommendation:        - Patient has a contact number available for                         emergencies. The signs and symptoms of potential                         delayed complications were discussed with the patient.  Return to normal activities tomorrow. Written                         discharge instructions were provided to the patient.                        - Resume previous diet.                        - Continue present medications.                        - Await pathology results.                        - Return to physician assistant in 2 months.                        - Follow up with Juan Moores, PA-C in the GI office.                         [phone]                        -  The findings and recommendations were discussed with                         the patient. Procedure Code(s):     --- Professional ---                        [phone], Esophagogastroduodenoscopy, flexible,                         transoral; with biopsy, single or multiple Diagnosis Code(s):     --- Professional ---                        K57.10, Diverticulosis of small intestine without                         perforation or abscess without bleeding                        R11.0, Nausea                        R68.81, Early satiety                        R10.13, Epigastric pain                        K29.70, Gastritis, unspecified, without bleeding                        K44.9, Diaphragmatic hernia without obstruction or                         gangrene                        K22.2, Esophageal obstruction CPT copyright 2019 American Medical Association. All rights reserved. The codes documented in this report are  preliminary and upon coder review may  be revised to meet current compliance requirements. Stanton Kidney MD, MD 12/08/2020 3:03:27 PM This report has been signed electronically. Number of Addenda: 0 Note Initiated On: 12/08/2020 2:30 PM Estimated Blood Loss:  Estimated blood loss: none. Estimated blood loss: none.      [facility]

## 2020-12-08 NOTE — H&P (Signed)
Outpatient short stay form Pre-procedure 12/08/2020 2:35 PM Juan Webster K. Norma Fredrickson, M.D.  Primary Physician: Daniel Nones III, M.D.  Reason for visit:  Early satiety, postprandial abdominal pain, nausea without vomiting.  History of present illness:  85 y/o wm complains of several months of early satiety, postprandial lower abdominal pain as well as some nausea without vomiting.  Gastric emptying scan showed normal gastric emptying at 4 hours.  Esophagram revealed mild distal tertiary contractions without stricture or resistance to passage of barium.  Patient continues, however, to have symptoms of abdominal discomfort and nausea.  Endoscopy has been scheduled today for further evaluation.    Current Facility-Administered Medications:    0.9 %  sodium chloride infusion, , Intravenous, Continuous, Leonna Schlee, Boykin Nearing, MD  Medications Prior to Admission  Medication Sig Dispense Refill Last Dose   albuterol (VENTOLIN HFA) 108 (90 Base) MCG/ACT inhaler Inhale 1-2 puffs into the lungs every 6 (six) hours as needed for wheezing or shortness of breath.   Past Week   aspirin 81 MG tablet Take 81 mg by mouth daily.   12/07/2020   carvedilol (COREG) 12.5 MG tablet Take 12.5 mg by mouth 2 (two) times daily with a meal.   12/07/2020   glimepiride (AMARYL) 4 MG tablet Take 4 mg by mouth 2 (two) times daily.   12/07/2020   hydrochlorothiazide (HYDRODIURIL) 25 MG tablet Take 25 mg by mouth daily.   12/07/2020   traMADol (ULTRAM) 50 MG tablet Take 50 mg by mouth in the morning and at bedtime.   12/07/2020   acetaminophen (TYLENOL) 500 MG tablet Take 500 mg by mouth every 6 (six) hours as needed for moderate pain.      azelastine (ASTELIN) 0.1 % nasal spray Place 1 spray into both nostrils 2 (two) times daily as needed for allergies or rhinitis.      Choline Fenofibrate (FENOFIBRIC ACID) 135 MG CPDR Take 135 mg by mouth daily.      clopidogrel (PLAVIX) 75 MG tablet Take 75 mg by mouth daily.   04/28/2020   donepezil  (ARICEPT) 5 MG tablet Take 5 mg by mouth daily.      folic acid (FOLVITE) 1 MG tablet Take 1 mg by mouth daily.      HYDROcodone-acetaminophen (NORCO/VICODIN) 5-325 MG tablet Take 0.5-1 tablets by mouth every 8 (eight) hours as needed for moderate pain. (Patient not taking: Reported on 12/08/2020) 10 tablet 0 Not Taking   hydroxychloroquine (PLAQUENIL) 200 MG tablet Take 200 mg by mouth daily.      isosorbide mononitrate (IMDUR) 30 MG 24 hr tablet Take 1 tablet (30 mg total) by mouth daily. 30 tablet 0    lisinopril (PRINIVIL,ZESTRIL) 20 MG tablet Take 0.5 tablets (10 mg total) by mouth daily.      metFORMIN (GLUCOPHAGE) 1000 MG tablet Take 1,000 mg by mouth 2 (two) times daily with a meal.      Omega-3 Fatty Acids (OMEGA-3 FISH OIL) 1200 MG CAPS Take 1,200 mg by mouth 2 (two) times daily.      pantoprazole (PROTONIX) 40 MG tablet Take 40 mg by mouth 2 (two) times daily.      Semaglutide,0.25 or 0.5MG /DOS, (OZEMPIC, 0.25 OR 0.5 MG/DOSE,) 2 MG/1.5ML SOPN Inject 0.25 mg into the skin every Sunday.        Allergies  Allergen Reactions   Ace Inhibitors    Dapagliflozin Other (See Comments)    And worsening renal function   Donepezil Other (See Comments)    Patient has bad  dreams   Simvastatin     Muscle weakness     Past Medical History:  Diagnosis Date   Anxiety    Arthritis    B12 deficiency    CAD (coronary artery disease)    a.) stents to LAD and RCA in 11/2006   Chronic anticoagulation    Clopidogrel   Chronic kidney disease (CKD), stage III (moderate) (HCC)    DCM (dilated cardiomyopathy) (HCC)    Diverticulosis    GERD (gastroesophageal reflux disease)    Gout    Hard of hearing    History of hiatal hernia    History of kidney stones    History of second degree heart block    PPM in place   Hypertension    IBS (irritable bowel syndrome)    MRSA cellulitis 2019   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   NSTEMI (non-ST elevated myocardial infarction) (HCC)  07/02/2016   Presence of permanent cardiac pacemaker 10/17/00, 07/31/07   [employer]tronic Adapta DR  ADDRL1   T2DM (type 2 diabetes mellitus) (HCC)    Thrombocytopenia (HCC)    TIA (transient ischemic attack)    Wears dentures    full upper, partial lower    Review of systems:  Otherwise negative.    Physical Exam  Gen: Alert, oriented. Appears stated age.  HEENT: [place]/AT. PERRLA. Lungs: CTA, no wheezes. CV: RR nl S1, S2. Abd: soft, benign, no masses. BS+ Ext: No edema. Pulses 2+    Planned procedures: Proceed with Esophagogastroduodenoscopy. The patient understands the nature of the planned procedure, indications, risks, alternatives and potential complications including but not limited to bleeding, infection, perforation, damage to internal organs and possible oversedation/side effects from anesthesia. The patient agrees and gives consent to proceed.  Please refer to procedure notes for findings, recommendations and patient disposition/instructions.     Juan Webster K. Norma Fredrickson, M.D. Gastroenterology 12/08/2020  2:35 PM

## 2020-12-08 NOTE — Interval H&P Note (Signed)
History and Physical Interval Note:  12/08/2020 2:38 PM  Juan Webster  has presented today for surgery, with the diagnosis of (R11.0) NAUSEA AND VOMITING  (R10.84) GENERALIZED POTPRANDIAL ABDOMINAL PAIN  (R68.81) EARLY SATIETY (K21.9) GASTROESOPHAGEAL REFLUX DISEASE.  The various methods of treatment have been discussed with the patient and family. After consideration of risks, benefits and other options for treatment, the patient has consented to  Procedure(s): ESOPHAGOGASTRODUODENOSCOPY (EGD) (N/A) as a surgical intervention.  The patient's history has been reviewed, patient examined, no change in status, stable for surgery.  I have reviewed the patient's chart and labs.  Questions were answered to the patient's satisfaction.     [place], [place]

## 2020-12-09 ENCOUNTER — Encounter: Payer: Self-pay | Admitting: Internal Medicine

## 2020-12-09 NOTE — Anesthesia Postprocedure Evaluation (Signed)
Anesthesia Post Note  Patient: Juan Webster  Procedure(s) Performed: ESOPHAGOGASTRODUODENOSCOPY (EGD)  Patient location during evaluation: Endoscopy Anesthesia Type: General Level of consciousness: awake and alert Pain management: pain level controlled Vital Signs Assessment: post-procedure vital signs reviewed and stable Respiratory status: spontaneous breathing, nonlabored ventilation, respiratory function stable and patient connected to nasal cannula oxygen Cardiovascular status: blood pressure returned to baseline and stable Postop Assessment: no apparent nausea or vomiting Anesthetic complications: no   No notable events documented.   Last Vitals:  Vitals:   12/08/20 1522 12/08/20 1532  BP: 140/74 (!) 152/68  Pulse: 81 76  Resp: 15 18  Temp:    SpO2: 100% 100%    Last Pain:  Vitals:   12/09/20 0749  TempSrc:   PainSc: 0-No pain                 Cleda Mccreedy [place]

## 2020-12-10 LAB — SURGICAL PATHOLOGY

## 2020-12-22 ENCOUNTER — Other Ambulatory Visit: Payer: Self-pay

## 2020-12-22 ENCOUNTER — Emergency Department
Admission: EM | Admit: 2020-12-22 | Discharge: 2020-12-22 | Disposition: A | Discharge status: Home / Self Care | Payer: Medicare HMO | Attending: Emergency Medicine | Admitting: Emergency Medicine

## 2020-12-22 ENCOUNTER — Emergency Department: Payer: Medicare HMO

## 2020-12-22 DIAGNOSIS — Z7984 Long term (current) use of oral hypoglycemic drugs: Secondary | ICD-10-CM | POA: Diagnosis not present

## 2020-12-22 DIAGNOSIS — Z7982 Long term (current) use of aspirin: Secondary | ICD-10-CM | POA: Insufficient documentation

## 2020-12-22 DIAGNOSIS — N1832 Chronic kidney disease, stage 3b: Secondary | ICD-10-CM | POA: Insufficient documentation

## 2020-12-22 DIAGNOSIS — R42 Dizziness and giddiness: Secondary | ICD-10-CM | POA: Diagnosis not present

## 2020-12-22 DIAGNOSIS — Z79899 Other long term (current) drug therapy: Secondary | ICD-10-CM | POA: Insufficient documentation

## 2020-12-22 DIAGNOSIS — I2510 Atherosclerotic heart disease of native coronary artery without angina pectoris: Secondary | ICD-10-CM | POA: Insufficient documentation

## 2020-12-22 DIAGNOSIS — E1122 Type 2 diabetes mellitus with diabetic chronic kidney disease: Secondary | ICD-10-CM | POA: Diagnosis not present

## 2020-12-22 DIAGNOSIS — E119 Type 2 diabetes mellitus without complications: Secondary | ICD-10-CM

## 2020-12-22 DIAGNOSIS — I129 Hypertensive chronic kidney disease with stage 1 through stage 4 chronic kidney disease, or unspecified chronic kidney disease: Secondary | ICD-10-CM | POA: Diagnosis not present

## 2020-12-22 LAB — CBC
HCT: 39.8 % (ref 39.0–52.0)
Hemoglobin: 13.2 g/dL (ref 13.0–17.0)
MCH: 28.1 pg (ref 26.0–34.0)
MCHC: 33.2 g/dL (ref 30.0–36.0)
MCV: 84.9 fL (ref 80.0–100.0)
Platelets: 159 10*3/uL (ref 150–400)
RBC: 4.69 MIL/uL (ref 4.22–5.81)
RDW: 13.3 % (ref 11.5–15.5)
WBC: 8.7 10*3/uL (ref 4.0–10.5)
nRBC: 0 % (ref 0.0–0.2)

## 2020-12-22 LAB — BASIC METABOLIC PANEL
Anion gap: 15 (ref 5–15)
BUN: 36 mg/dL — ABNORMAL HIGH (ref 8–23)
CO2: 23 mmol/L (ref 22–32)
Calcium: 9.6 mg/dL (ref 8.9–10.3)
Chloride: 100 mmol/L (ref 98–111)
Creatinine, Ser: 1.57 mg/dL — ABNORMAL HIGH (ref 0.61–1.24)
GFR, Estimated: 43 mL/min — ABNORMAL LOW (ref >60)
Glucose, Bld: 203 mg/dL — ABNORMAL HIGH (ref 70–99)
Potassium: 4.3 mmol/L (ref 3.5–5.1)
Sodium: 138 mmol/L (ref 135–145)

## 2020-12-22 NOTE — ED Triage Notes (Addendum)
Pt to ED for dizziness since Sunday, denies weakness. States having trouble with balance/walking.  Denies n.v.d Denies burning with urination

## 2020-12-22 NOTE — ED Provider Notes (Signed)
[facility] Emergency Department Provider Note  ____________________________________________  Time seen: Approximately 5:51 PM  I have reviewed the triage vital signs and the nursing notes.   HISTORY  Chief Complaint Dizziness    HPI Juan Webster is a 85 y.o. male with a history of anxiety B12 deficiency GERD CKD hypertension who comes ED complaining of dizziness for the past few days.  Feels like his balance is little bit off, no lateralizing weakness or paresthesia, no vision changes.  No headaches or trauma.  Symptoms are waxing and waning without aggravating or alleviating factors.    Past Medical History:  Diagnosis Date   Anxiety    Arthritis    B12 deficiency    CAD (coronary artery disease)    a.) stents to LAD and RCA in 11/2006   Chronic anticoagulation    Clopidogrel   Chronic kidney disease (CKD), stage III (moderate) (HCC)    DCM (dilated cardiomyopathy) (HCC)    Diverticulosis    GERD (gastroesophageal reflux disease)    Gout    Hard of hearing    History of hiatal hernia    History of kidney stones    History of second degree heart block    PPM in place   Hypertension    IBS (irritable bowel syndrome)    MRSA cellulitis 2019   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   NSTEMI (non-ST elevated myocardial infarction) (HCC) 07/02/2016   Presence of permanent cardiac pacemaker 10/17/00, 07/31/07   [employer] Adapta DR  ADDRL1   T2DM (type 2 diabetes mellitus) (HCC)    Thrombocytopenia (HCC)    TIA (transient ischemic attack)    Wears dentures    full upper, partial lower     Patient Active Problem List   Diagnosis Date Noted   Bradycardia 03/01/2018   Non-ST elevated myocardial infarction (HCC) 07/02/2016     Past Surgical History:  Procedure Laterality Date   CATARACT EXTRACTION W/PHACO Left 06/30/2015   Procedure: CATARACT EXTRACTION PHACO AND INTRAOCULAR LENS PLACEMENT (IOC);  Surgeon: Lockie Mola, MD;   Location: [facility];  Service: Ophthalmology;  Laterality: Left;  DIABETIC - oral meds   CATARACT EXTRACTION W/PHACO Right 07/28/2015   Procedure: CATARACT EXTRACTION PHACO AND INTRAOCULAR LENS PLACEMENT (IOC) right eye;  Surgeon: Lockie Mola, MD;  Location: [facility];  Service: Ophthalmology;  Laterality: Right;  DIABETIC - oral meds TORIC   COLONOSCOPY     CORONARY ANGIOPLASTY  11/12/2006   stent - RDA   CYSTOSCOPY N/A 08/31/2020   Procedure: CYSTOSCOPY/FLEXIBLE;  Surgeon: Riki Altes, MD;  Location: [facility];  Service: Urology;  Laterality: N/A;   ESOPHAGOGASTRODUODENOSCOPY N/A 12/08/2020   Procedure: ESOPHAGOGASTRODUODENOSCOPY (EGD);  Surgeon: Toledo, Boykin Nearing, MD;  Location: [facility];  Service: Gastroenterology;  Laterality: N/A;   EYE SURGERY     HYDROCELE EXCISION Left 08/31/2020   Procedure: HYDROCELECTOMY ADULT VS. SPERMATOCELECTOMY;  Surgeon: Riki Altes, MD;  Location: [facility];  Service: Urology;  Laterality: Left;   INSERT / REPLACE / REMOVE PACEMAKER  10/17/00, 07/31/07   LEFT HEART CATH AND CORONARY ANGIOGRAPHY N/A 07/03/2016   Procedure: Left Heart Cath and Coronary Angiography;  Surgeon: Dalia Heading, MD;  Location: [facility];  Service: Cardiovascular;  Laterality: N/A;   NECK SURGERY  2015   SHOULDER SURGERY Bilateral 1990   x3   SPERMATOCELECTOMY N/A 08/31/2020   Procedure: SPERMATOCELECTOMY;  Surgeon: Riki Altes, MD;  Location:  [facility];  Service: Urology;  Laterality: N/A;   TONSILLECTOMY       Prior to Admission medications   Medication Sig Start Date End Date Taking? Authorizing Provider  acetaminophen (TYLENOL) 500 MG tablet Take 500 mg by mouth every 6 (six) hours as needed for moderate pain.    [provider]  albuterol (VENTOLIN HFA) 108 (90 Base) MCG/ACT inhaler Inhale 1-2 puffs into the lungs every 6 (six) hours as needed for wheezing or shortness of breath.    [provider]   aspirin 81 MG tablet Take 81 mg by mouth daily.    [provider]  azelastine (ASTELIN) 0.1 % nasal spray Place 1 spray into both nostrils 2 (two) times daily as needed for allergies or rhinitis. 04/14/19   [provider]  carvedilol (COREG) 12.5 MG tablet Take 12.5 mg by mouth 2 (two) times daily with a meal.    [provider]  Choline Fenofibrate (FENOFIBRIC ACID) 135 MG CPDR Take 135 mg by mouth daily. 05/31/20   [provider]  clopidogrel (PLAVIX) 75 MG tablet Take 75 mg by mouth daily.    [provider]  donepezil (ARICEPT) 5 MG tablet Take 5 mg by mouth daily. 09/10/17 09/10/18  [provider]  folic acid (FOLVITE) 1 MG tablet Take 1 mg by mouth daily.    [provider]  glimepiride (AMARYL) 4 MG tablet Take 4 mg by mouth 2 (two) times daily.    [provider]  hydrochlorothiazide (HYDRODIURIL) 25 MG tablet Take 25 mg by mouth daily.    [provider]  HYDROcodone-acetaminophen (NORCO/VICODIN) 5-325 MG tablet Take 0.5-1 tablets by mouth every 8 (eight) hours as needed for moderate pain. Patient not taking: Reported on 12/08/2020 08/31/20   Riki Altes, MD  hydroxychloroquine (PLAQUENIL) 200 MG tablet Take 200 mg by mouth daily.    [provider]  isosorbide mononitrate (IMDUR) 30 MG 24 hr tablet Take 1 tablet (30 mg total) by mouth daily. 07/04/16   Milagros Loll, MD  lisinopril (PRINIVIL,ZESTRIL) 20 MG tablet Take 0.5 tablets (10 mg total) by mouth daily. 07/04/16   Milagros Loll, MD  metFORMIN (GLUCOPHAGE) 1000 MG tablet Take 1,000 mg by mouth 2 (two) times daily with a meal.    [provider]  Omega-3 Fatty Acids (OMEGA-3 FISH OIL) 1200 MG CAPS Take 1,200 mg by mouth 2 (two) times daily.    [provider]  pantoprazole (PROTONIX) 40 MG tablet Take 40 mg by mouth 2 (two) times daily. 09/11/17   [provider]  Semaglutide,0.25 or 0.5MG /DOS, (OZEMPIC, 0.25 OR 0.5  MG/DOSE,) 2 MG/1.5ML SOPN Inject 0.25 mg into the skin every Sunday.    [provider]  traMADol (ULTRAM) 50 MG tablet Take 50 mg by mouth in the morning and at bedtime.    [provider]     Allergies Ace inhibitors, Dapagliflozin, Donepezil, and Simvastatin   No family history on file.  Social History Social History   Tobacco Use   Smoking status: Never   Smokeless tobacco: Never  Substance Use Topics   Alcohol use: No   Drug use: Never    Review of Systems  Constitutional:   No fever or chills.  ENT:   No sore throat. No rhinorrhea. Cardiovascular:   No chest pain or syncope. Respiratory:   No dyspnea or cough. Gastrointestinal:   Negative for abdominal pain, vomiting and diarrhea.  Musculoskeletal:   Negative for focal pain or  swelling All other systems reviewed and are negative except as documented above in ROS and HPI.  ____________________________________________   PHYSICAL EXAM:  VITAL SIGNS: ED Triage Vitals  Enc Vitals Group     BP 12/22/20 1415 138/62     Pulse Rate 12/22/20 1415 85     Resp 12/22/20 1415 18     Temp 12/22/20 1413 98.2 F (36.8 C)     Temp Source 12/22/20 1413 Oral     SpO2 12/22/20 1415 96 %     Weight 12/22/20 1414 190 lb (86.2 kg)     Height 12/22/20 1414 6' (1.829 m)     Head Circumference --      Peak Flow --      Pain Score 12/22/20 1414 0     Pain Loc --      Pain Edu? --      Excl. in GC? --     Vital signs reviewed, nursing assessments reviewed.   Constitutional:   Alert and oriented. Non-toxic appearance. Eyes:   Conjunctivae are normal. EOMI. PERRL. ENT      Head:   Normocephalic and atraumatic.      Nose:   Wearing a mask.      Mouth/Throat:   Wearing a mask.      Neck:   No meningismus. Full ROM. Hematological/Lymphatic/Immunilogical:   No cervical lymphadenopathy. Cardiovascular:   RRR. Symmetric bilateral radial and DP pulses.  No murmurs. Cap refill less than 2 seconds. Respiratory:    Normal respiratory effort without tachypnea/retractions. Breath sounds are clear and equal bilaterally. No wheezes/rales/rhonchi. Gastrointestinal:   Soft and nontender. Non distended. There is no CVA tenderness.  No rebound, rigidity, or guarding. Genitourinary:   deferred Musculoskeletal:   Normal range of motion in all extremities. No joint effusions.  No lower extremity tenderness.  No edema. Neurologic:   Normal speech and language.  Motor grossly intact.  Normal gait. NIH stroke scale 0 No acute focal neurologic deficits are appreciated.  Skin:    Skin is warm, dry and intact. No rash noted.  No petechiae, purpura, or bullae.  ____________________________________________    LABS (pertinent positives/negatives) (all labs ordered are listed, but only abnormal results are displayed) Labs Reviewed  BASIC METABOLIC PANEL - Abnormal; Notable for the following components:      Result Value   Glucose, Bld 203 (*)    BUN 36 (*)    Creatinine, Ser 1.57 (*)    GFR, Estimated 43 (*)    All other components within normal limits  CBC  URINALYSIS, COMPLETE (UACMP) WITH MICROSCOPIC   ____________________________________________   EKG  Interpreted by me Atrial sensed ventricular pacemaker, left axis, left bundle branch block.  No acute ischemic changes.  ____________________________________________    RADIOLOGY  CT HEAD WO CONTRAST ( )  Result Date: 12/22/2020 CLINICAL DATA:  Dizziness EXAM: CT HEAD WITHOUT CONTRAST TECHNIQUE: Contiguous axial images were obtained from the base of the skull through the vertex without intravenous contrast. COMPARISON:  CT head 09/27/2017 FINDINGS: Brain: Generalized atrophy. Mild white matter hypodensity bilaterally. Negative for acute infarct, hemorrhage, mass. Vascular: Negative for hyperdense vessel Skull: Negative Sinuses/Orbits: Paranasal sinuses clear. Bilateral cataract extraction Other: None IMPRESSION: No acute intracranial abnormality.  No  change from the prior study. Electronically Signed   By: Marlan Palau M.D.   On: 12/22/2020 16:18    ____________________________________________   PROCEDURES Procedures  ____________________________________________  DIFFERENTIAL DIAGNOSIS   Intracranial hemorrhage, intracranial mass, dehydration, electrolyte abnormality  CLINICAL IMPRESSION /  ASSESSMENT AND PLAN / ED COURSE  Medications ordered in the ED: Medications - No data to display  Pertinent labs & imaging results that were available during my care of the patient were reviewed by me and considered in my medical decision making (see chart for details).  Juan Webster was evaluated in Emergency Department on 12/22/2020 for the symptoms described in the history of present illness. He was evaluated in the context of the global COVID-19 pandemic, which necessitated consideration that the patient might be at risk for infection with the SARS-CoV-2 virus that causes COVID-19. Institutional protocols and algorithms that pertain to the evaluation of patients at risk for COVID-19 are in a state of rapid change based on information released by regulatory bodies including the CDC and federal and state organizations. These policies and algorithms were followed during the patient's care in the ED.   Patient presents with episodes of dizziness which she describes as transient lightheadedness with standing and occasional feeling of off balance.  No falls.  Neuro exam is normal.  CT scan is unremarkable.  Vital signs and labs are normal.  Offered MRI but patient wishes to defer for now until he follows up with his PCP.  He has follow-up soon to continue monitoring the discontinuation of one of his diabetes medications, and he will discuss his symptoms with Dr. Graciela Husbands at that visit to determine if an MRI is needed at that point.  I doubt meningitis encephalitis dissection or arterial occlusion.  No evidence of hemorrhage.  No evidence of acute stroke  currently.  Stable for discharge.     ____________________________________________   FINAL CLINICAL IMPRESSION(S) / ED DIAGNOSES    Final diagnoses:  Dizziness  Stage 3b chronic kidney disease (HCC)  Type 2 diabetes mellitus without complication, unspecified whether long term insulin use [facility])     ED Discharge Orders     None       Portions of this note were generated with dragon dictation software. Dictation errors may occur despite best attempts at proofreading.    Sharman Cheek, MD 12/23/20 1945

## 2020-12-22 NOTE — ED Triage Notes (Signed)
First RN note:    Pt comes in from [facility] clinic for dizziness.  Pt evaluated over there thinking it was inner ear problems, but ears looked good and patient has unsteady gait.  H/o TIA's.  Pt also admits to feeling "out of it" today.

## 2020-12-22 NOTE — ED Notes (Signed)
E-signature pad unavailable - Pt verbalized understanding of D/C information - no additional concerns at this time.   Pt declined a wheelchair at D/C - pt ambulatory at discharge independently.

## 2020-12-22 NOTE — ED Notes (Signed)
Pt states that he has been having intermittent dizziness since Sunday. Pt states that it started on Sunday then went away on Monday and Tuesday and then returned today, pt states that he went to the dr to have his left ear checked to see if it was clogged. Pt states that he has been using otc products to help get rid of anything that could be stuck in the ear. Pt reports the liquid he has put in is all coming back out

## 2021-03-25 ENCOUNTER — Encounter: Payer: Self-pay | Admitting: Emergency Medicine

## 2021-03-25 ENCOUNTER — Emergency Department
Admission: EM | Admit: 2021-03-25 | Discharge: 2021-03-25 | Disposition: A | Discharge status: Home / Self Care | Payer: Medicare HMO | Attending: Emergency Medicine | Admitting: Emergency Medicine

## 2021-03-25 ENCOUNTER — Other Ambulatory Visit: Payer: Self-pay

## 2021-03-25 DIAGNOSIS — R42 Dizziness and giddiness: Secondary | ICD-10-CM

## 2021-03-25 DIAGNOSIS — Z950 Presence of cardiac pacemaker: Secondary | ICD-10-CM | POA: Insufficient documentation

## 2021-03-25 DIAGNOSIS — R55 Syncope and collapse: Secondary | ICD-10-CM | POA: Diagnosis not present

## 2021-03-25 DIAGNOSIS — Z8679 Personal history of other diseases of the circulatory system: Secondary | ICD-10-CM | POA: Diagnosis not present

## 2021-03-25 DIAGNOSIS — R0602 Shortness of breath: Secondary | ICD-10-CM | POA: Insufficient documentation

## 2021-03-25 DIAGNOSIS — R0600 Dyspnea, unspecified: Secondary | ICD-10-CM | POA: Insufficient documentation

## 2021-03-25 DIAGNOSIS — R0609 Other forms of dyspnea: Secondary | ICD-10-CM

## 2021-03-25 NOTE — ED Triage Notes (Addendum)
C/O headache and dizziness and SOB x 6 months.  AAOx3.  Skin warm and dry no apparent distress.  Patient states he has had an extensive work up out patient with PCP.  Symptoms not resolving.  Patient has multiple symptoms and complaints, with main c/o headache and dizziness.

## 2021-03-25 NOTE — ED Provider Notes (Signed)
[facility] Provider Note    Event Date/Time   First MD Initiated Contact with Patient 03/25/21 1745     (approximate)  History   Dizziness and Shortness of Breath  HPI Juan Webster is a 86 y.o. male with a stated past medical history of cardiomyopathy with pacemaker in place as well as previous ACS who presents for orthostatic lightheadedness and headaches.  Patient states that the symptoms have been present over the last few months and have been worsening despite medication changes by his cardiologist.  Patient denies any chest pain but does endorse some dyspnea on exertion     Physical Exam   Triage Vital Signs: ED Triage Vitals  Enc Vitals Group     BP 03/25/21 1041 (!) 155/76     Pulse Rate 03/25/21 1041 65     Resp 03/25/21 1041 16     Temp 03/25/21 1041 98.1 F (36.7 C)     Temp Source 03/25/21 1041 Oral     SpO2 03/25/21 1041 99 %     Weight 03/25/21 1040 190 lb 0.6 oz (86.2 kg)     Height 03/25/21 1040 6' (1.829 m)     Head Circumference --      Peak Flow --      Pain Score 03/25/21 1039 0     Pain Loc --      Pain Edu? --      Excl. in GC? --     Most recent vital signs: Vitals:   03/25/21 1041 03/25/21 1640  BP: (!) 155/76 (!) 152/74  Pulse: 65 65  Resp: 16 16  Temp: 98.1 F (36.7 C)   SpO2: 99% 98%    General: Awake, no distress.  CV:  Good peripheral perfusion.  Resp:  Normal effort. Abd:  No distention.  Other:  Elderly Caucasian male lying in bed in no distress   ED Results / Procedures / Treatments   Labs (all labs ordered are listed, but only abnormal results are displayed) Labs Reviewed - No data to display  PROCEDURES:  Critical Care performed: No  .1-3 Lead EKG Interpretation Performed by: Merwyn Katos, MD Authorized by: Merwyn Katos, MD     Interpretation: normal     ECG rate:  67   ECG rate assessment: normal     Rhythm: paced     Ectopy: none     Conduction: normal     MEDICATIONS  ORDERED IN ED: Medications - No data to display   IMPRESSION / MDM / ASSESSMENT AND PLAN / ED COURSE  I reviewed the triage vital signs and the nursing notes.                              Differential diagnosis includes, but is not limited to, orthostatic syncope, ACS, CVA, TIA  The patient is on the cardiac monitor to evaluate for evidence of arrhythmia and/or significant heart rate changes.  Patient presents complaining of headache, dizziness, and shortness of breath of the last 6 months with the past medical history as stated above.  Patient had extensive work-up by his cardiologist on 03/15/2021 including a stress test that showed decreased cardiac function likely the cause of many of this patient's symptoms.  As patient was complaining of headache at this visit he did stop patient's Imdur and patient states that he did not notice any change in his symptoms.  Patient's symptoms do  sound especially orthostatic as well as related to exertion likely due to his cardiomyopathy.  Patient had orthostatic vital signs performed that did not show any evidence of acute abnormalities however patient did complain of a headache after sitting from a supine position.  I did consider admission for this patient however given the benign symptoms, negative orthostatic vital signs, and agreement with patient to follow-up with his cardiologist in the next 1-3 days, he will not require admission at this time  Dispo: Discharge home with cardiology follow-up        FINAL CLINICAL IMPRESSION(S) / ED DIAGNOSES   Final diagnoses:  Orthostatic lightheadedness  Near syncope  Dyspnea on exertion  History of cardiomyopathy     Rx / DC Orders   ED Discharge Orders     None        Note:  This document was prepared using Dragon voice recognition software and may include unintentional dictation errors.   Merwyn Katos, MD 03/25/21 [phone]

## 2021-03-25 NOTE — Discharge Instructions (Addendum)
Your next appointment with Dr. Gwen Pounds is 04/13/2021 for evaluation of you pacemaker

## 2021-08-16 ENCOUNTER — Other Ambulatory Visit: Payer: Self-pay | Admitting: Family Medicine

## 2021-08-16 DIAGNOSIS — M5412 Radiculopathy, cervical region: Secondary | ICD-10-CM

## 2021-08-23 ENCOUNTER — Ambulatory Visit
Admission: RE | Admit: 2021-08-23 | Discharge: 2021-08-23 | Disposition: A | Discharge status: Home / Self Care | Payer: Medicare HMO | Source: Ambulatory Visit | Attending: Family Medicine | Admitting: Family Medicine

## 2021-08-23 DIAGNOSIS — M5412 Radiculopathy, cervical region: Secondary | ICD-10-CM | POA: Insufficient documentation

## 2021-11-07 ENCOUNTER — Emergency Department: Payer: Medicare HMO

## 2021-11-07 ENCOUNTER — Observation Stay
Admission: EM | Admit: 2021-11-07 | Discharge: 2021-11-08 | Disposition: A | Discharge status: Home / Self Care | Payer: Medicare HMO | Attending: Internal Medicine | Admitting: Internal Medicine

## 2021-11-07 ENCOUNTER — Other Ambulatory Visit: Payer: Self-pay

## 2021-11-07 ENCOUNTER — Encounter (HOSPITAL_COMMUNITY): Payer: Self-pay

## 2021-11-07 DIAGNOSIS — I129 Hypertensive chronic kidney disease with stage 1 through stage 4 chronic kidney disease, or unspecified chronic kidney disease: Secondary | ICD-10-CM | POA: Insufficient documentation

## 2021-11-07 DIAGNOSIS — Z9861 Coronary angioplasty status: Secondary | ICD-10-CM

## 2021-11-07 DIAGNOSIS — Z7984 Long term (current) use of oral hypoglycemic drugs: Secondary | ICD-10-CM | POA: Insufficient documentation

## 2021-11-07 DIAGNOSIS — Z950 Presence of cardiac pacemaker: Secondary | ICD-10-CM | POA: Insufficient documentation

## 2021-11-07 DIAGNOSIS — R519 Headache, unspecified: Secondary | ICD-10-CM | POA: Diagnosis not present

## 2021-11-07 DIAGNOSIS — Z955 Presence of coronary angioplasty implant and graft: Secondary | ICD-10-CM | POA: Insufficient documentation

## 2021-11-07 DIAGNOSIS — M48062 Spinal stenosis, lumbar region with neurogenic claudication: Secondary | ICD-10-CM | POA: Diagnosis not present

## 2021-11-07 DIAGNOSIS — E119 Type 2 diabetes mellitus without complications: Secondary | ICD-10-CM

## 2021-11-07 DIAGNOSIS — I10 Essential (primary) hypertension: Secondary | ICD-10-CM | POA: Diagnosis present

## 2021-11-07 DIAGNOSIS — M5416 Radiculopathy, lumbar region: Secondary | ICD-10-CM | POA: Diagnosis not present

## 2021-11-07 DIAGNOSIS — M47812 Spondylosis without myelopathy or radiculopathy, cervical region: Secondary | ICD-10-CM | POA: Diagnosis present

## 2021-11-07 DIAGNOSIS — Z7985 Long-term (current) use of injectable non-insulin antidiabetic drugs: Secondary | ICD-10-CM | POA: Insufficient documentation

## 2021-11-07 DIAGNOSIS — Z7902 Long term (current) use of antithrombotics/antiplatelets: Secondary | ICD-10-CM | POA: Diagnosis not present

## 2021-11-07 DIAGNOSIS — Z7982 Long term (current) use of aspirin: Secondary | ICD-10-CM | POA: Insufficient documentation

## 2021-11-07 DIAGNOSIS — E1122 Type 2 diabetes mellitus with diabetic chronic kidney disease: Secondary | ICD-10-CM | POA: Insufficient documentation

## 2021-11-07 DIAGNOSIS — N183 Chronic kidney disease, stage 3 unspecified: Secondary | ICD-10-CM | POA: Diagnosis not present

## 2021-11-07 DIAGNOSIS — R42 Dizziness and giddiness: Secondary | ICD-10-CM | POA: Diagnosis not present

## 2021-11-07 DIAGNOSIS — Z8673 Personal history of transient ischemic attack (TIA), and cerebral infarction without residual deficits: Secondary | ICD-10-CM | POA: Diagnosis not present

## 2021-11-07 DIAGNOSIS — Z79899 Other long term (current) drug therapy: Secondary | ICD-10-CM | POA: Insufficient documentation

## 2021-11-07 DIAGNOSIS — I2510 Atherosclerotic heart disease of native coronary artery without angina pectoris: Secondary | ICD-10-CM | POA: Diagnosis not present

## 2021-11-07 DIAGNOSIS — K219 Gastro-esophageal reflux disease without esophagitis: Secondary | ICD-10-CM

## 2021-11-07 LAB — COMPREHENSIVE METABOLIC PANEL
ALT: 15 U/L (ref 0–44)
AST: 22 U/L (ref 15–41)
Albumin: 4.3 g/dL (ref 3.5–5.0)
Alkaline Phosphatase: 54 U/L (ref 38–126)
Anion gap: 8 (ref 5–15)
BUN: 36 mg/dL — ABNORMAL HIGH (ref 8–23)
CO2: 23 mmol/L (ref 22–32)
Calcium: 9.6 mg/dL (ref 8.9–10.3)
Chloride: 108 mmol/L (ref 98–111)
Creatinine, Ser: 1.56 mg/dL — ABNORMAL HIGH (ref 0.61–1.24)
GFR, Estimated: 44 mL/min — ABNORMAL LOW (ref >60)
Glucose, Bld: 134 mg/dL — ABNORMAL HIGH (ref 70–99)
Potassium: 4.6 mmol/L (ref 3.5–5.1)
Sodium: 139 mmol/L (ref 135–145)
Total Bilirubin: 0.8 mg/dL (ref 0.3–1.2)
Total Protein: 7.1 g/dL (ref 6.5–8.1)

## 2021-11-07 LAB — GLUCOSE, CAPILLARY: Glucose-Capillary: 137 mg/dL — ABNORMAL HIGH (ref 70–99)

## 2021-11-07 LAB — CBC WITH DIFFERENTIAL/PLATELET
Abs Immature Granulocytes: 0.04 10*3/uL (ref 0.00–0.07)
Basophils Absolute: 0.1 10*3/uL (ref 0.0–0.1)
Basophils Relative: 1 %
Eosinophils Absolute: 0.1 10*3/uL (ref 0.0–0.5)
Eosinophils Relative: 2 %
HCT: 42.8 % (ref 39.0–52.0)
Hemoglobin: 14.1 g/dL (ref 13.0–17.0)
Immature Granulocytes: 1 %
Lymphocytes Relative: 19 %
Lymphs Abs: 1.4 10*3/uL (ref 0.7–4.0)
MCH: 29.4 pg (ref 26.0–34.0)
MCHC: 32.9 g/dL (ref 30.0–36.0)
MCV: 89.4 fL (ref 80.0–100.0)
Monocytes Absolute: 0.6 10*3/uL (ref 0.1–1.0)
Monocytes Relative: 7 %
Neutro Abs: 5.3 10*3/uL (ref 1.7–7.7)
Neutrophils Relative %: 70 %
Platelets: 148 10*3/uL — ABNORMAL LOW (ref 150–400)
RBC: 4.79 MIL/uL (ref 4.22–5.81)
RDW: 12.7 % (ref 11.5–15.5)
WBC: 7.4 10*3/uL (ref 4.0–10.5)
nRBC: 0 % (ref 0.0–0.2)

## 2021-11-07 LAB — PROTIME-INR
INR: 1.1 (ref 0.8–1.2)
Prothrombin Time: 14.1 seconds (ref 11.4–15.2)

## 2021-11-07 LAB — APTT: aPTT: 28 seconds (ref 24–36)

## 2021-11-07 LAB — SEDIMENTATION RATE: Sed Rate: 5 mm/hr (ref 0–20)

## 2021-11-07 MED ORDER — SODIUM CHLORIDE 0.9 % IV BOLUS
500.0000 mL | Freq: Once | INTRAVENOUS | Status: AC
  Administered 2021-11-07: 500 mL via INTRAVENOUS

## 2021-11-07 MED ORDER — PANTOPRAZOLE SODIUM 40 MG PO TBEC
40.0000 mg | DELAYED_RELEASE_TABLET | Freq: Two times a day (BID) | ORAL | Status: DC
  Administered 2021-11-08: 40 mg via ORAL
  Filled 2021-11-07: qty 1

## 2021-11-07 MED ORDER — ACETAMINOPHEN 650 MG RE SUPP: 650.0000 mg | RECTAL | Status: DC | PRN

## 2021-11-07 MED ORDER — ENOXAPARIN SODIUM 40 MG/0.4ML IJ SOSY
40.0000 mg | PREFILLED_SYRINGE | Freq: Every day | INTRAMUSCULAR | Status: DC
  Administered 2021-11-07: 40 mg via SUBCUTANEOUS
  Filled 2021-11-07: qty 0.4

## 2021-11-07 MED ORDER — TRAMADOL HCL 50 MG PO TABS
100.0000 mg | ORAL_TABLET | Freq: Two times a day (BID) | ORAL | Status: DC | PRN
  Administered 2021-11-08: 100 mg via ORAL
  Filled 2021-11-07: qty 2

## 2021-11-07 MED ORDER — SPIRONOLACTONE 25 MG PO TABS
12.5000 mg | ORAL_TABLET | Freq: Every day | ORAL | Status: DC
  Administered 2021-11-08: 12.5 mg via ORAL
  Filled 2021-11-07: qty 0.5

## 2021-11-07 MED ORDER — FOLIC ACID 1 MG PO TABS
1.0000 mg | ORAL_TABLET | Freq: Every day | ORAL | Status: DC
  Administered 2021-11-08: 1 mg via ORAL
  Filled 2021-11-07: qty 1

## 2021-11-07 MED ORDER — INSULIN ASPART 100 UNIT/ML IJ SOLN: 0.0000 [IU] | Freq: Three times a day (TID) | INTRAMUSCULAR | Status: DC

## 2021-11-07 MED ORDER — SENNOSIDES-DOCUSATE SODIUM 8.6-50 MG PO TABS: 1.0000 | ORAL_TABLET | Freq: Every evening | ORAL | Status: DC | PRN

## 2021-11-07 MED ORDER — HYDROXYCHLOROQUINE SULFATE 200 MG PO TABS
200.0000 mg | ORAL_TABLET | Freq: Every day | ORAL | Status: DC
  Administered 2021-11-08: 200 mg via ORAL
  Filled 2021-11-07: qty 1

## 2021-11-07 MED ORDER — METOPROLOL SUCCINATE ER 25 MG PO TB24
25.0000 mg | ORAL_TABLET | Freq: Every day | ORAL | Status: DC
  Administered 2021-11-08: 25 mg via ORAL
  Filled 2021-11-07: qty 1

## 2021-11-07 MED ORDER — ACETAMINOPHEN 160 MG/5ML PO SOLN: 650.0000 mg | ORAL | Status: DC | PRN

## 2021-11-07 MED ORDER — ALBUTEROL SULFATE (2.5 MG/3ML) 0.083% IN NEBU: 3.0000 mL | INHALATION_SOLUTION | Freq: Four times a day (QID) | RESPIRATORY_TRACT | Status: DC | PRN

## 2021-11-07 MED ORDER — ACETAMINOPHEN 325 MG PO TABS
650.0000 mg | ORAL_TABLET | ORAL | Status: DC | PRN
  Administered 2021-11-07: 650 mg via ORAL
  Filled 2021-11-07: qty 2

## 2021-11-07 MED ORDER — HYDRALAZINE HCL 10 MG PO TABS: 10.0000 mg | ORAL_TABLET | Freq: Four times a day (QID) | ORAL | Status: DC | PRN

## 2021-11-07 MED ORDER — INSULIN ASPART 100 UNIT/ML IJ SOLN: 0.0000 [IU] | Freq: Every day | INTRAMUSCULAR | Status: DC

## 2021-11-07 MED ORDER — STROKE: EARLY STAGES OF RECOVERY BOOK: Freq: Once | Status: AC

## 2021-11-07 MED ORDER — IOHEXOL 350 MG/ML SOLN
100.0000 mL | Freq: Once | INTRAVENOUS | Status: AC | PRN
  Administered 2021-11-07: 100 mL via INTRAVENOUS

## 2021-11-07 MED ORDER — ALLOPURINOL 100 MG PO TABS
100.0000 mg | ORAL_TABLET | Freq: Every day | ORAL | Status: DC
  Administered 2021-11-08: 100 mg via ORAL
  Filled 2021-11-07: qty 1

## 2021-11-07 MED ORDER — SACUBITRIL-VALSARTAN 24-26 MG PO TABS
1.0000 | ORAL_TABLET | Freq: Two times a day (BID) | ORAL | Status: DC
  Administered 2021-11-07 – 2021-11-08 (×2): 1 via ORAL
  Filled 2021-11-07 (×4): qty 1

## 2021-11-07 MED ORDER — ASPIRIN 81 MG PO TBEC
81.0000 mg | DELAYED_RELEASE_TABLET | Freq: Every day | ORAL | Status: DC
  Administered 2021-11-08: 81 mg via ORAL
  Filled 2021-11-07: qty 1

## 2021-11-07 MED ORDER — FENOFIBRATE 160 MG PO TABS
160.0000 mg | ORAL_TABLET | Freq: Every day | ORAL | Status: DC
  Administered 2021-11-08: 160 mg via ORAL
  Filled 2021-11-07: qty 1

## 2021-11-07 NOTE — Assessment & Plan Note (Signed)
-   Holding home oral antiglycemic agents and injections: Metformin 1000 mg p.o. twice daily, Ozempic, glimepiride 4 mg daily - Insulin SSI with at bedtime coverage ordered

## 2021-11-07 NOTE — Assessment & Plan Note (Signed)
New onset headache in an elderly male - Neurology has been consulted, recommends admission and patient will be seen  - Complete echo - MRI of the brain cannot be completed due to incompatible pacemaker - Fasting lipid and A1c ordered - Frequent neuro vascular checks - Fall precaution

## 2021-11-07 NOTE — Hospital Course (Addendum)
Mr. Juan Webster is an 86 year old male with history of non-insulin-dependent diabetes mellitus, hypertension, 2-lead pacemaker in situ, GERD, who presents emergency department for chief concerns of new onset headache.  Initial vitals in the emergency department showed temperature of 98.3, respiration rate of 18, heart rate of 59, blood pressure 176/82, SPO2 of 94% on room air.  Serum sodium is 139, potassium 4.6, chloride 108, bicarb 23, BUN of 36, serum creatinine 1.56, GFR 44, nonfasting blood glucose 134, WBC 7.4, hemoglobin 14.1, platelets of 148.  EDP consulted neurology who recommends admission for further work-up.  ED treatment: Sodium chloride 500 mL bolus.

## 2021-11-07 NOTE — Assessment & Plan Note (Signed)
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

## 2021-11-07 NOTE — Assessment & Plan Note (Addendum)
-   Resumed home aspirin 81 mg daily, metoprolol succinate 25 mg daily, Entresto 24-26 mg, 1 tablet twice daily, spironolactone 12.5 mg daily
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
## 2021-11-07 NOTE — ED Triage Notes (Signed)
Pt coming in today with chronic headache and dizziness persisting intermittently for months.PT stating that today they are experiencing increased balance issues, and headache but unable to decipher when it started exactly.  PT ambulatory to triage. Alert and oriented. PT on asa and another thinner they cant remember.

## 2021-11-07 NOTE — Assessment & Plan Note (Signed)
PPI ?

## 2021-11-07 NOTE — ED Provider Notes (Signed)
[facility] Provider Note    Event Date/Time   First MD Initiated Contact with Patient 11/07/21 1525     (approximate)   History   Headache   HPI  Juan Webster is a 86 y.o. male who reports over a month of gradually worsening headaches diffusely right now it is on top of his head.  He has been gradually getting worse and today was considerably worse.  He also has dizziness when he moves.  He sometimes has to hold onto things moving around or turning.  This also appeared to get worse today. ----------------------------------------- 4:56 PM on 11/07/2021 ----------------------------------------- Patient clarifies that this headache has been present for possibly a year not before that though.  Its been getting gradually worse and fluctuating in intensity today it was the worst its ever been but it has gotten a little bit better now.     Physical Exam   Triage Vital Signs: ED Triage Vitals  Enc Vitals Group     BP 11/07/21 1136 (!) 176/82     Pulse Rate 11/07/21 1136 (!) 59     Resp 11/07/21 1136 18     Temp 11/07/21 1136 98.3 F (36.8 C)     Temp Source 11/07/21 1502 Oral     SpO2 11/07/21 1136 94 %     Weight 11/07/21 1137 192 lb (87.1 kg)     Height 11/07/21 1457 6' (1.829 m)     Head Circumference --      Peak Flow --      Pain Score 11/07/21 1137 7     Pain Loc --      Pain Edu? --      Excl. in GC? --     Most recent vital signs: Vitals:   11/07/21 2200 11/07/21 2201  BP: (!) 166/63 (!) 160/66  Pulse: 61 60  Resp: 17   Temp: 97.8 F (36.6 C)   SpO2: 98%      General: Awake, no distress.  Head normocephalic atraumatic Eyes pupils equal round reactive extraocular movements intact  mouth no erythema or exudates CV:  Good peripheral perfusion.  Heart regular rate and rhythm no audible murmurs Resp:  Normal effort.  Lungs clear Abd:  No distention.  Cranial nerves II through XII are intact although visual fields were not checked  cerebellar finger-nose rapid alternating movements in the hands are normal motor strength is 5/5 throughout patient denies any numbness   ED Results / Procedures / Treatments   Labs (all labs ordered are listed, but only abnormal results are displayed) Labs Reviewed  COMPREHENSIVE METABOLIC PANEL - Abnormal; Notable for the following components:      Result Value   Glucose, Bld 134 (*)    BUN 36 (*)    Creatinine, Ser 1.56 (*)    GFR, Estimated 44 (*)    All other components within normal limits  CBC WITH DIFFERENTIAL/PLATELET - Abnormal; Notable for the following components:   Platelets 148 (*)    All other components within normal limits  GLUCOSE, CAPILLARY - Abnormal; Notable for the following components:   Glucose-Capillary 137 (*)    All other components within normal limits  APTT  PROTIME-INR  SEDIMENTATION RATE  URINE DRUG SCREEN, QUALITATIVE ([facility] ONLY)  LIPID PANEL  HEMOGLOBIN A1C     EK EKG read and interpreted by me shows atrial sensed ventricular paced rhythm rate of 66 no acute ST-T changes that I can see.    RADIOLOGY  CT of the head read by radiology reviewed and interpreted by me shows no acute disease Chest x-ray read by radiology reviewed and interpreted by me shows no acute disease.  PROCEDURES:  Critical Care performed: Critical care time 50 minutes.  This includes speaking with the neurologist twice and texting her and calling [facility] in an effort to find out about this gentleman's pacemaker and then calling [employer] and talking to the rep at [employer].  Additionally I spoke with the patient himself and his sister-in-law who had brought him to the hospital.  I spoken to them 3 times now to keep them up-to-date on what is been going on.  Dr. Selina Cooley, neurology thinks we should try to get a CT head with contrast and a CT angio and then put him in the hospital overnight so she can see him in the morning.  We will get this done.  I relayed this to the  patient.  Procedures   MEDICATIONS ORDERED IN ED: Medications  acetaminophen (TYLENOL) tablet 650 mg (650 mg Oral Given 11/07/21 2111)    Or  acetaminophen (TYLENOL) 160 MG/5ML solution 650 mg ( Per Tube See Alternative 11/07/21 2111)    Or  acetaminophen (TYLENOL) suppository 650 mg ( Rectal See Alternative 11/07/21 2111)  senna-docusate (Senokot-S) tablet 1 tablet (has no administration in time range)  enoxaparin (LOVENOX) injection 40 mg (40 mg Subcutaneous Given 11/07/21 2112)  aspirin EC tablet 81 mg (has no administration in time range)  pantoprazole (PROTONIX) EC tablet 40 mg (has no administration in time range)  albuterol (PROVENTIL) (2.5 MG/3ML) 0.083% nebulizer solution 3 mL (has no administration in time range)  folic acid (FOLVITE) tablet 1 mg (has no administration in time range)  insulin aspart (novoLOG) injection 0-5 Units ( Subcutaneous Not Given 11/07/21 2220)  insulin aspart (novoLOG) injection 0-15 Units (has no administration in time range)  hydrALAZINE (APRESOLINE) tablet 10 mg (has no administration in time range)  allopurinol (ZYLOPRIM) tablet 100 mg (has no administration in time range)  traMADol (ULTRAM) tablet 100 mg (has no administration in time range)  hydroxychloroquine (PLAQUENIL) tablet 200 mg (has no administration in time range)  fenofibrate tablet 160 mg (has no administration in time range)  metoprolol succinate (TOPROL-XL) 24 hr tablet 25 mg (has no administration in time range)  sacubitril-valsartan (ENTRESTO) 24-26 mg per tablet (1 tablet Oral Given 11/07/21 2325)  spironolactone (ALDACTONE) tablet 12.5 mg (has no administration in time range)  iohexol (OMNIPAQUE) 350 MG/ML injection 100 mL (100 mLs Intravenous Contrast Given 11/07/21 1720)  sodium chloride 0.9 % bolus 500 mL (500 mLs Intravenous New Bag/Given 11/07/21 1745)   stroke: early stages of recovery book ( Does not apply Given 11/07/21 2202)     IMPRESSION / MDM / ASSESSMENT AND PLAN / ED  COURSE  I reviewed the triage vital signs and the nursing notes. Gust patient with neurology Dr. Selina Cooley who feels this gentleman being 24 with a new onset headache would be in need of an MRI.  Unfortunately has a [employer] pacemaker defibrillator.  I have his card he has 3 items listed on it with 1 with an implant date of 17 October 2000 serial number [id number] model #[id number] numbers implant date 30 March 2021 serial number [id number] [id number] [id number] model number DTPA 2 QQ next 1 same implant date [id number] V model number [id number] Same implant date serial number Q [id number] [id number] model [id number].  I discussed the patient in detail with [employer] rep.  [employer] looked  it up patient is not MRI conditional not MRI compatible one lead that he has is not MRI compatible and there is one abandoned lead in him these 2 things both disqualify him from having an MRI no matter what. ----------------------------------------- 4:56 PM on 11/07/2021 -----------------------------------------  Discussed with Dr. Selina Cooley.  We are both concerned about this relatively new onset headache in this elderly gentleman.  I had initially thought that the headache had been going on for several months not a year but now I understand it a year however it still fairly new onset in an old gentleman.  Dr. Selina Cooley suggests when we thought it was several months that we try to get a CT head with contrast and a CT angio.  Then she request that we admit him to the hospital overnight and she will see him in the morning. Differential diagnosis includes, but is not limited to, vasculitis although this is less likely with a sed rate of 5.  Some sort of posterior fossa tumor could or possibly some very slow infection although it does not have a fever or white count.  Patient's presentation is most consistent with acute presentation with potential threat to life or bodily function.  The patient is on the cardiac monitor to evaluate for evidence of arrhythmia and/or  significant heart rate changes.  Only bradycardia is seen    FINAL CLINICAL IMPRESSION(S) / ED DIAGNOSES   Final diagnoses:  Nonintractable headache, unspecified chronicity pattern, unspecified headache type  Dizziness     Rx / DC Orders   ED Discharge Orders     None        Note:  This document was prepared using Dragon voice recognition software and may include unintentional dictation errors.   Arnaldo Natal, MD 11/07/21 [phone]

## 2021-11-07 NOTE — ED Notes (Signed)
[facility]  called  per  DR  Malinda  MD 

## 2021-11-07 NOTE — ED Provider Triage Note (Signed)
Emergency Medicine Provider Triage Evaluation Note  Juan Webster, a 86 y.o. male  was evaluated in triage.  Pt with a history of ESRD, hypertension, CKD, TIA, and anticoagulation therapy, complains of acute on chronic headache as well as some persistent dizziness.  Patient was given history of several months of intermittent dizziness.  He presents today experiencing some balance issues and headache.  Patient unable to give clear history as to timing onset.  Review of Systems  Positive: Headache, dizziness Negative: CP, SOB  Physical Exam  BP (!) 176/82   Pulse (!) 59   Temp 98.3 F (36.8 C)   Resp 18   Wt 87.1 kg   SpO2 94%   BMI 26.04 kg/m  Gen:   Awake, no distress  NAD  Resp:  Normal effort CTA MSK:   Moves extremities without difficulty  CVS:  RRR  Medical Decision Making  Medically screening exam initiated at 11:43 AM.  Appropriate orders placed.  Juan Webster was informed that the remainder of the evaluation will be completed by another provider, this initial triage assessment does not replace that evaluation, and the importance of remaining in the ED until their evaluation is complete.  Geriatric patient to the ED for evaluation of chronic dizziness with acute headache with onset yesterday.  Patient presents to the ED via personal vehicle for evaluation.   Juan Hoard, PA-C 11/07/21 1146

## 2021-11-07 NOTE — Assessment & Plan Note (Signed)
-   Continue home tramadol 100 mg p.o. twice daily as needed for moderate pain

## 2021-11-07 NOTE — Assessment & Plan Note (Addendum)
-   Metoprolol succinate 25 mg daily, Entresto 24-26 mg tablets, twice daily, spironolactone 12.5 mg p.o. daily resumed - Hydralazine 10 mg p.o. every 6 hours as needed for SBP creatinine 180, 4 days ordered

## 2021-11-07 NOTE — ED Notes (Signed)
See triage note  Presents with some h/a and dizziness  States he has had h/a for about 1 year or so  States he gets relief with po meds at home but today this was worse

## 2021-11-07 NOTE — Assessment & Plan Note (Signed)
-   Continue home tramadol 100 mg twice daily as needed for moderate pain

## 2021-11-08 ENCOUNTER — Encounter: Payer: Self-pay | Admitting: Internal Medicine

## 2021-11-08 ENCOUNTER — Observation Stay: Payer: Medicare HMO

## 2021-11-08 DIAGNOSIS — K219 Gastro-esophageal reflux disease without esophagitis: Secondary | ICD-10-CM | POA: Diagnosis not present

## 2021-11-08 DIAGNOSIS — R4182 Altered mental status, unspecified: Secondary | ICD-10-CM

## 2021-11-08 DIAGNOSIS — R519 Headache, unspecified: Secondary | ICD-10-CM | POA: Diagnosis not present

## 2021-11-08 DIAGNOSIS — R42 Dizziness and giddiness: Secondary | ICD-10-CM | POA: Diagnosis not present

## 2021-11-08 DIAGNOSIS — I10 Essential (primary) hypertension: Secondary | ICD-10-CM | POA: Diagnosis not present

## 2021-11-08 LAB — URINE DRUG SCREEN, QUALITATIVE (ARMC ONLY)
Amphetamines, Ur Screen: NOT DETECTED
Barbiturates, Ur Screen: NOT DETECTED
Benzodiazepine, Ur Scrn: NOT DETECTED
Cannabinoid 50 Ng, Ur ~~LOC~~: NOT DETECTED
Cocaine Metabolite,Ur ~~LOC~~: NOT DETECTED
MDMA (Ecstasy)Ur Screen: POSITIVE — AB
Methadone Scn, Ur: NOT DETECTED
Opiate, Ur Screen: NOT DETECTED
Phencyclidine (PCP) Ur S: NOT DETECTED
Tricyclic, Ur Screen: NOT DETECTED

## 2021-11-08 LAB — CREATININE, SERUM
Creatinine, Ser: 1.5 mg/dL — ABNORMAL HIGH (ref 0.61–1.24)
GFR, Estimated: 46 mL/min — ABNORMAL LOW (ref >60)

## 2021-11-08 LAB — HEMOGLOBIN A1C
Hgb A1c MFr Bld: 6.6 % — ABNORMAL HIGH (ref 4.8–5.6)
Mean Plasma Glucose: 142.72 mg/dL

## 2021-11-08 LAB — LIPID PANEL
Cholesterol: 159 mg/dL (ref 0–200)
HDL: 38 mg/dL — ABNORMAL LOW (ref >40)
LDL Cholesterol: 70 mg/dL (ref 0–99)
Total CHOL/HDL Ratio: 4.2 RATIO
Triglycerides: 253 mg/dL — ABNORMAL HIGH (ref <150)
VLDL: 51 mg/dL — ABNORMAL HIGH (ref 0–40)

## 2021-11-08 LAB — GLUCOSE, CAPILLARY
Glucose-Capillary: 90 mg/dL (ref 70–99)
Glucose-Capillary: 159 mg/dL — ABNORMAL HIGH (ref 70–99)

## 2021-11-08 MED ORDER — IOHEXOL 350 MG/ML SOLN
75.0000 mL | Freq: Once | INTRAVENOUS | Status: AC | PRN
  Administered 2021-11-08: 75 mL via INTRAVENOUS

## 2021-11-08 NOTE — Progress Notes (Signed)
OT Cancellation Note  Patient Details Name: Juan Webster MRN: [id number] DOB: 10/29/1935   Cancelled Treatment:    Reason Eval/Treat Not Completed: OT screened, no needs identified, will sign off. Per chart review and nurse communication, pt is at baseline level of functional for self care and mobility. No acute OT needs at this time. OT to complete orders.   Jackquline Denmark, MS, OTR/L , CBIS ascom [phone]  11/08/21, 11:34 AM

## 2021-11-08 NOTE — Discharge Summary (Signed)
Physician Discharge Summary   Patient: Juan Webster MRN: [id number] DOB: 1935/07/18  Admit date:     11/07/2021  Discharge date: 11/08/21  Discharge Physician: Arnetha Courser   PCP: Lynnea Ferrier, MD   Recommendations at discharge:  Follow-up with primary care provider within a week. Follow-up with neurosurgery to discuss about cervical radiculopathy  Discharge Diagnoses: Principal Problem:   Headache Active Problems:   Essential hypertension   Diabetes mellitus type 2, noninsulin dependent (HCC)   GERD (gastroesophageal reflux disease)   CAD S/P percutaneous coronary angioplasty   Lumbar stenosis with neurogenic claudication   Lumbar radiculitis   Osteoarthritis of neck   Hospital Course: Juan Webster is an 86 year old male with history of non-insulin-dependent diabetes mellitus, hypertension, 2-lead pacemaker in situ, GERD, who presents emergency department for chief concerns of new onset headache.  He reports he has had this headache for several years however over the past 3 days it has worsened.  It is at the point where he cannot handle it anymore.  He reports the pain is 10 out of 10 and intermittent.  He denies new trauma to his person.     He denies mild double vision. He endorses blurry vision.  Initial vitals in the emergency department showed temperature of 98.3, respiration rate of 18, heart rate of 59, blood pressure 176/82, SPO2 of 94% on room air.  Serum sodium is 139, potassium 4.6, chloride 108, bicarb 23, BUN of 36, serum creatinine 1.56, GFR 44, nonfasting blood glucose 134, WBC 7.4, hemoglobin 14.1, platelets of 148.  EDP consulted neurology who recommends admission for further work-up.  ED treatment: Sodium chloride 500 mL bolus.  CT head was negative for any acute abnormality. CTA head and neck was negative for any significant stenosis MRI of the brain cannot be completed due to incompatible pacemaker. CT cervical spine with 1. Interval C4-C7 ACDF  with suspected solid arthrodesis and no evidence of significant residual spinal stenosis. Moderate to severe residual bilateral neural foraminal stenosis at C6-7 and moderate left foraminal stenosis at C5-6. 2. Severe adjacent segment disease at C3-4 with mild-to-moderate right and moderate to severe left neural foraminal stenosis. 3. Increased, moderate right neural foraminal stenosis at C7-T1.  8/29: Lipid panel with elevated triglyceride at 253, HDL 38 and LDL 70. UDS positive for ecstasy, interestingly patient does not use any drugs but fenofibrate and metoprolol can cause false positive results. CTA was negative for any significant stenosis. CT venous studies were negative for any venous sinus thrombosis.  Patient is having these headaches for a long time. He ate pretty salty food a day prior which resulted in elevated blood pressure which might be responsible with this worsening of his headache.  Patient denies any acute change in his vision.  Blurry vision which is progressively worsening is going on for couple of years for which he need to see an ophthalmologist as an outpatient.  Case also discussed with neurosurgery for concern of cervical radiculopathy and they will see him as an outpatient for further evaluation and recommendations.  Neurology was also consulted and no further work-up needed after ruling out cavernous sinus thrombosis.  Patient will continue on his current medications, and need to have a close follow-up with his providers for further recommendations.  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 twice daily as needed for moderate pain  CAD S/P percutaneous coronary angioplasty - Resumed home aspirin 81 mg daily, metoprolol succinate 25 mg daily, Entresto 24-26 mg, 1 tablet twice daily, spironolactone 12.5 mg daily  GERD (gastroesophageal reflux disease) - PPI  Diabetes mellitus type 2, noninsulin dependent (HCC) - Holding home oral antiglycemic agents and injections: Metformin 1000 mg p.o. twice daily, Ozempic, glimepiride 4 mg daily - Insulin SSI with at bedtime coverage ordered  Essential hypertension - Metoprolol succinate 25 mg daily, Entresto 24-26 mg tablets, twice daily, spironolactone 12.5 mg p.o. daily resumed - Hydralazine 10 mg p.o. every 6 hours as needed for SBP creatinine 180, 4 days ordered   Consultants: Curbside neurosurgery, neurology Procedures performed: None Disposition: Home Diet recommendation:  Discharge Diet Orders (From admission, onward)     Start     Ordered   11/08/21 0000  Diet - low sodium heart healthy        11/08/21 1524           Cardiac and Carb modified diet DISCHARGE MEDICATION: Allergies as of 11/08/2021       Reactions   Ace Inhibitors    Dapagliflozin Other (See Comments)   And worsening renal function   Donepezil Other (See Comments)   Patient has bad dreams   Simvastatin    Muscle weakness        Medication List     STOP taking these medications    HYDROcodone-acetaminophen 5-325 MG tablet Commonly known as: NORCO/VICODIN       TAKE these medications    acetaminophen 500 MG tablet Commonly known as: TYLENOL Take 500 mg by mouth every 6 (six) hours as needed for moderate pain.   albuterol 108 (90 Base) MCG/ACT inhaler Commonly known as: VENTOLIN HFA Inhale  1-2 puffs into the lungs every 6 (six) hours as needed for wheezing or shortness of breath.   allopurinol 100 MG tablet Commonly known as: ZYLOPRIM Take 100 mg by mouth daily.   aspirin 81 MG tablet Take 81 mg by mouth daily.   Fenofibric Acid 135 MG Cpdr Take 135 mg by mouth daily.   folic acid 1 MG tablet Commonly known as: FOLVITE Take 1 mg by mouth daily.   glimepiride 4 MG tablet Commonly known as: AMARYL Take 4 mg by mouth 2 (two) times daily.   hydroxychloroquine 200 MG tablet Commonly known as: PLAQUENIL Take 200 mg by mouth daily.   metFORMIN 1000 MG tablet Commonly known as: GLUCOPHAGE Take 1,000 mg by mouth daily.   metoprolol succinate 25 MG 24 hr tablet Commonly known as: TOPROL-XL Take 25 mg by mouth daily.   Omega-3 Fish Oil 1200 MG Caps Take 1,200 mg by mouth 2 (two) times daily.   Ozempic (0.25 or 0.5 MG/DOSE) 2 MG/1.5ML Sopn Generic drug: Semaglutide(0.25 or 0.5MG /DOS) Inject 0.25 mg into the skin every Sunday.   pantoprazole 40 MG tablet Commonly known as: PROTONIX Take 40 mg by mouth 2 (two) times daily.   sacubitril-valsartan 24-26 MG Commonly known as: ENTRESTO Take 1 tablet by mouth 2 (two) times daily.  spironolactone 25 MG tablet Commonly known as: ALDACTONE Take 12.5 mg by mouth daily.   traMADol 50 MG tablet Commonly known as: ULTRAM Take 100 mg by mouth 2 (two) times daily.        Follow-up Information     Curtis Sites III, MD. Schedule an appointment as soon as possible for a visit in 1 week(s).   Specialty: Internal Medicine Contact information: [address] [facility] [place] [address] [phone]         Venetia Night, MD. Schedule an appointment as soon as possible for a visit in 1 week(s).   Specialty: Neurosurgery Contact information: [address] [place] [address] [place] [address] [phone]                Discharge Exam: Ceasar Mons Weights   11/07/21 1137 11/07/21  1457 11/07/21 2200  Weight: 87.1 kg 87 kg 89.4 kg   General.     In no acute distress. Pulmonary.  Lungs clear bilaterally, normal respiratory effort. CV.  Regular rate and rhythm, no JVD, rub or murmur. Abdomen.  Soft, nontender, nondistended, BS positive. CNS.  Alert and oriented .  No focal neurologic deficit. Extremities.  No edema, no cyanosis, pulses intact and symmetrical. Psychiatry.  Judgment and insight appears normal.   Condition at discharge: stable  The results of significant diagnostics from this hospitalization (including imaging, microbiology, ancillary and laboratory) are listed below for reference.   Imaging Studies: CT VENOGRAM HEAD  Result Date: 11/08/2021 CLINICAL DATA:  Dural venous sinus thrombosis suspected EXAM: CT VENOGRAM HEAD TECHNIQUE: Noncontrast images of the brain were obtained. Venographic phase images of the brain were obtained following the administration of intravenous contrast. Multiplanar reformats and maximum intensity projections were generated. RADIATION DOSE REDUCTION: This exam was performed according to the departmental dose-optimization program which includes automated exposure control, adjustment of the mA and/or kV according to patient size and/or use of iterative reconstruction technique. CONTRAST:  2mL OMNIPAQUE IOHEXOL 350 MG/ML SOLN COMPARISON:  11/07/2021 head CT FINDINGS: CT head: Brain: There is no acute intracranial hemorrhage, mass effect, or edema. No new loss of gray-white differentiation. Patchy low-density in the supratentorial white matter is nonspecific but probably reflects stable chronic microvascular ischemic changes. There is no extra-axial fluid collection. Ventricles and sulci are stable in size and configuration. Vascular: There is atherosclerotic calcification at the skull base. Skull: Calvarium is unremarkable. Sinuses/Orbits: No acute finding. Other: Mastoid air cells are clear. CT venogram: Superior sagittal sinus is patent.  Straight sinus, vein of Galen, and internal cerebral veins are patent. Transverse and sigmoid sinuses are patent. IMPRESSION: No acute intracranial abnormality. No evidence of dural sinus thrombosis. Electronically Signed   By: Guadlupe Spanish M.D.   On: 11/08/2021 13:40   CT Angio Head W or Wo Contrast  Result Date: 11/07/2021 CLINICAL DATA:  Sudden headache and dizziness. EXAM: CT ANGIOGRAPHY HEAD TECHNIQUE: Multidetector CT imaging of the head was performed using the standard protocol during bolus administration of intravenous contrast. Multiplanar CT image reconstructions and MIPs were obtained to evaluate the vascular anatomy. RADIATION DOSE REDUCTION: This exam was performed according to the departmental dose-optimization program which includes automated exposure control, adjustment of the mA and/or kV according to patient size and/or use of iterative reconstruction technique. CONTRAST:  OMNIPAQUE IOHEXOL 350 MG/ML SOLN COMPARISON:  Same-day noncontrast CT head FINDINGS: CT HEAD Noncontrast CT head was performed earlier the same day. There is no abnormal postcontrast intracranial enhancement on the delayed phase  images. CTA HEAD Anterior circulation: There is calcified plaque in the intracranial ICAs without hemodynamically significant stenosis or occlusion. The bilateral MCAs are patent without proximal stenosis or occlusion. The bilateral ACAs are patent without proximal stenosis or occlusion. There is no aneurysm or AVM. Posterior circulation: The bilateral V4 segments are patent with mild calcified plaque bilaterally but no hemodynamically significant stenosis or occlusion the basilar artery is patent. The major cerebellar artery origins appear patent. The bilateral PCAs are patent. There is mild focal stenosis of the distal left P1 segment. There is no other proximal stenosis or occlusion. The posterior communicating arteries are not identified. There is no aneurysm or AVM. Venous sinuses:  Patent. Anatomic variants: None. Review of the MIP images confirms the above findings. IMPRESSION: 1. No aneurysm identified. 2. Patent intracranial vasculature. Mild plaque at the carotid siphons and V4 segments without hemodynamically significant stenosis or occlusion, and mild focal stenosis of the distal left P1 segment. Electronically Signed   By: Lesia HausenPeter  Noone M.D.   On: 11/07/2021 17:54   DG Chest Portable 1 View  Result Date: 11/07/2021 CLINICAL DATA:  Placement of pacemaker/defibrillator EXAM: PORTABLE CHEST 1 VIEW COMPARISON:  Previous studies including the examination of 07/02/2016 FINDINGS: Transverse diameter of heart is slightly increased. There is interval replacement of pacemaker battery with pacemaker/defibrillator battery in the left infraclavicular region. Biventricular pacer leads are noted in place. Visualized lung fields are clear of any pulmonary edema or focal infiltrates. There is no pleural effusion or pneumothorax. There is surgical fusion in lower cervical spine. Old fracture is seen in right clavicle. Degenerative changes are noted in left AC joint. IMPRESSION: Pacemaker/defibrillator battery is seen in the left infraclavicular region with biventricular leads in place. Lung fields are clear of any infiltrates or pulmonary edema. Electronically Signed   By: Ernie AvenaPalani  Rathinasamy M.D.   On: 11/07/2021 16:57   CT Head Wo Contrast  Result Date: 11/07/2021 CLINICAL DATA:  Headache. EXAM: CT HEAD WITHOUT CONTRAST TECHNIQUE: Contiguous axial images were obtained from the base of the skull through the vertex without intravenous contrast. RADIATION DOSE REDUCTION: This exam was performed according to the departmental dose-optimization program which includes automated exposure control, adjustment of the mA and/or kV according to patient size and/or use of iterative reconstruction technique. COMPARISON:  None Available. FINDINGS: Brain: No evidence of acute infarction, hemorrhage,  hydrocephalus, extra-axial collection or mass lesion/mass effect. Vascular: Calcified atherosclerotic changes identified in the intracranial carotids. Skull: Normal. Negative for fracture or focal lesion. Sinuses/Orbits: No acute finding. Other: None. IMPRESSION: 1. No acute intracranial abnormality to explain the patient's headache. Electronically Signed   By: Gerome Samavid  Williams III M.D.   On: 11/07/2021 12:07    Microbiology: Results for orders placed or performed in visit on 08/20/20  CULTURE, URINE COMPREHENSIVE     Status: None   Collection Time: 08/20/20  8:27 AM   Specimen: Urine   UR  Result Value Ref Range Status   Urine Culture, Comprehensive Final report  Final   Organism ID, Bacteria Comment  Final    Comment: Mixed urogenital flora 25,000-50,000 colony forming units per mL   Microscopic Examination     Status: Abnormal   Collection Time: 08/20/20  8:27 AM   Urine  Result Value Ref Range Status   WBC, UA None seen 0 - 5 /hpf Final   RBC, Urine 11-30 (A) 0 - 2 /hpf Final   Epithelial Cells (non renal) 0-10 0 - 10 /hpf Final   Bacteria, UA  None seen None seen/Few Final    Labs: CBC: Recent Labs  Lab 11/07/21 1150  WBC 7.4  NEUTROABS 5.3  HGB 14.1  HCT 42.8  MCV 89.4  PLT 148*   Basic Metabolic Panel: Recent Labs  Lab 11/07/21 1150 11/08/21 0951  NA 139  --   K 4.6  --   CL 108  --   CO2 23  --   GLUCOSE 134*  --   BUN 36*  --   CREATININE 1.56* 1.50*  CALCIUM 9.6  --    Liver Function Tests: Recent Labs  Lab 11/07/21 1150  AST 22  ALT 15  ALKPHOS 54  BILITOT 0.8  PROT 7.1  ALBUMIN 4.3   CBG: Recent Labs  Lab 11/07/21 2210 11/08/21 0754 11/08/21 1154  GLUCAP 137* 90 159*    Discharge time spent: greater than 30 minutes.  This record has been created using Conservation officer, historic buildings. Errors have been sought and corrected,but may not always be located. Such creation errors do not reflect on the standard of care.   Signed: Arnetha Courser, MD [facility] 11/08/2021

## 2021-11-08 NOTE — Progress Notes (Signed)
Mobility Specialist - Progress Note   11/08/21 1406  Mobility  Activity Ambulated independently in hallway  Level of Assistance Independent  Assistive Device None  Distance Ambulated (ft) 480 ft  Activity Response Tolerated well  $Mobility charge 1 Mobility   Pt sitting in recliner on RA upon arrival. Pt STS and ambulates 3 laps around NS indep. Pt returns to recliner with needs in reach and visitors in room.   Terrilyn Saver  Mobility Specialist  11/08/21 2:07 PM

## 2021-11-08 NOTE — Progress Notes (Signed)
Mobility Specialist - Progress Note   11/08/21 1025  Mobility  Activity Ambulated independently in hallway  Level of Assistance Independent  Distance Ambulated (ft) 320 ft  Activity Response Tolerated well  $Mobility charge 1 Mobility   Pt OOB ambulating in hallway on RA upon arrival. Pt ambulates indep. Pt returns to bed with needs in reach.   Terrilyn Saver  Mobility Specialist  11/08/21 10:26 AM

## 2021-11-08 NOTE — Progress Notes (Signed)
PT Cancellation Note  Patient Details Name: Juan Webster MRN: [id number] DOB: [birth year]   Cancelled Treatment:    Reason Eval/Treat Not Completed: Other (comment). Consult received and chart reviewed. Pt at baseline level, mobile in room. Doesn't feet he needs PT. Will sign off at this time. Please re-consult if needs change.   Davanta Meuser 11/08/2021, 10:00 AM Elizabeth Palau, PT, DPT, GCS [phone]

## 2021-11-08 NOTE — TOC Initial Note (Signed)
Transition of Care [facility]) - Initial/Assessment Note    Patient Details  Name: Juan Webster MRN: [id number] Date of Birth: 09/06/1935  Transition of Care [facility]) CM/SW Contact:    Truddie Hidden, RN Phone Number: 11/08/2021, 1:47 PM  Clinical Narrative:                  Transition of Care [facility]) Screening Note   Patient Details  Name: Juan Webster Date of Birth: [birth year]   Transition of Care [facility]) CM/SW Contact:    Truddie Hidden, RN Phone Number: 11/08/2021, 1:47 PM    Transition of Care Department [facility] has reviewed patient and no TOC needs have been identified at this time. We will continue to monitor patient advancement through interdisciplinary progression rounds. If new patient transition needs arise, please place a TOC consult.          Patient Goals and CMS Choice        Expected Discharge Plan and Services                                                Prior Living Arrangements/Services                       Activities of Daily Living Home Assistive Devices/Equipment: None ADL Screening (condition at time of admission) Patient's cognitive ability adequate to safely complete daily activities?: Yes Is the patient deaf or have difficulty hearing?: Yes Does the patient have difficulty seeing, even when wearing glasses/contacts?: No Does the patient have difficulty concentrating, remembering, or making decisions?: No Patient able to express need for assistance with ADLs?: Yes Does the patient have difficulty dressing or bathing?: No Independently performs ADLs?: Yes (appropriate for developmental age) Does the patient have difficulty walking or climbing stairs?: No Weakness of Legs: None Weakness of Arms/Hands: None  Permission Sought/Granted                  Emotional Assessment              Admission diagnosis:  Dizziness [R42] Headache [R51.9] Nonintractable headache, unspecified chronicity pattern, unspecified headache  type [R51.9] Patient Active Problem List   Diagnosis Date Noted   Headache 11/07/2021   Essential hypertension 11/07/2021   Diabetes mellitus type 2, noninsulin dependent (HCC) 11/07/2021   GERD (gastroesophageal reflux disease) 11/07/2021   CAD S/P percutaneous coronary angioplasty 11/07/2021   Lumbar stenosis with neurogenic claudication 11/07/2021   Lumbar radiculitis 11/07/2021   Osteoarthritis of neck 11/07/2021   Bradycardia 03/01/2018   Non-ST elevated myocardial infarction (HCC) 07/02/2016   PCP:  Lynnea Ferrier, MD Pharmacy:   [facility] [phone] - [place], [place] - [address] [place] [address] Phone: [phone] Fax: [phone]     Social Determinants of Health (SDOH) Interventions    Readmission Risk Interventions     No data to display

## 2021-11-08 NOTE — Progress Notes (Signed)
Eeg done 

## 2021-11-10 ENCOUNTER — Telehealth: Payer: Self-pay

## 2021-11-10 NOTE — Procedures (Signed)
Routine EEG Report  Juan Webster is a 86 y.o. male with a history of spells who is undergoing an EEG to evaluate for seizures.  Report: This EEG was acquired with electrodes placed according to the International 10-20 electrode system (including Fp1, Fp2, F3, F4, C3, C4, P3, P4, O1, O2, T3, T4, T5, T6, A1, A2, Fz, Cz, Pz). The following electrodes were missing or displaced: none.  The occipital dominant rhythm was 8.5 Hz. This activity is reactive to stimulation. Drowsiness was manifested by background fragmentation; deeper stages of sleep were identified by K complexes and sleep spindles. There was no focal slowing. There were no interictal epileptiform discharges. There were no electrographic seizures identified. There was no abnormal response to photic stimulation or hyperventilation.   Impression: This EEG was obtained while awake and asleep and is normal.    Clinical Correlation: Normal EEGs, however, do not rule out epilepsy.  Bing Neighbors, MD [facility] [phone]  If 7pm- 7am, please page neurology on call as listed in AMION.

## 2021-11-10 NOTE — Telephone Encounter (Signed)
-----   Message from Rockey Situ sent at 11/10/2021  9:24 AM EDT ----- Regarding: ER fu Contact: [phone] Patient seen in the ER for headache and neck pain. Discharge note states to f/u with Dr.Yarbrough in 1 week. Patient states he has had injection with physiatry but never PT.

## 2021-11-11 NOTE — Telephone Encounter (Signed)
He confirmed appt for 11/18/2021 at 9am with Stacy.

## 2021-11-11 NOTE — Telephone Encounter (Signed)
Schedule with Stacy. No MRI and no PT.

## 2021-11-15 NOTE — Progress Notes (Signed)
Referring Physician:  Lorella Nimrod, [place],  [place] [id number]  Primary Physician:  Adin Hector, MD  History of Present Illness:  History of ACDF C4-C7 in 2015 by Dr. Mauri Pole. Has AICD and cannot have MRI.   Was in hospital end of August for headache. CT of head negative. Also with known HTN, DM, GERD, CAD, TIA, MI, and CKD 3.   He is on plaquenil and sees rheumatology. Also takes ultram for his lumbar spine.   He did well after his cervical fusion. Now with over a year of posterior neck pain that radiates up into the back of his head to the top of his head. Also with some pain in left shoulder blade. No arm pain. No numbness, tingling, or weakness in his arms.   He works on [employer] and has to look down all day.  No significant balance issues- these improved after pacemaker/AICD.  Has been seeing a chiropractor with some relief.    Conservative measures:  Physical therapy: none recent, seeing chiropractor  Multimodal medical therapy including regular antiinflammatories: ultram for LBP  Injections: (Dr. Sharlet Salina) 09/19/2021: Left L4-5 and L5-S1 facet joint injections (90 percent relief) 08/31/2021: MBB to the left L4-5 and L5-S1 facet joints (9/10 to 0-1/10) 07/19/2021: Bilateral S1 transforaminal ESI (60% relief) 06/29/2020: Bilateral S1 transforaminal ESI (>50% improvement no more sharp pain)  09/24/2019: Right L4-5 and L5-S1 facet joints injections (no relief)  12/24/2018: Left L4-5 and L5-S1 facet joint injection (good relief) 09/04/2012: Bilateral L5-S1 transforaminal ESI (moderate to good relief) 08/13/2012: Bilateral S1 transforaminal ESI (good relief)  Past Surgery: ACDF C4-C7   Dub Amis has no symptoms of cervical myelopathy.  The symptoms are causing a significant impact on the patient's life.   Review of Systems:  A 10 point review of systems is negative, except for the pertinent positives and negatives detailed in the  HPI.  Past Medical History: Past Medical History:  Diagnosis Date   Anxiety    Arthritis    B12 deficiency    CAD (coronary artery disease)    a.) stents to LAD and RCA in 11/2006   Chronic anticoagulation    Clopidogrel   Chronic kidney disease (CKD), stage III (moderate) (HCC)    DCM (dilated cardiomyopathy) (HCC)    Diverticulosis    GERD (gastroesophageal reflux disease)    Gout    Hard of hearing    History of hiatal hernia    History of kidney stones    History of second degree heart block    PPM in place   Hypertension    IBS (irritable bowel syndrome)    MRSA cellulitis 2019   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   NSTEMI (non-ST elevated myocardial infarction) ([place]) 07/02/2016   Presence of permanent cardiac pacemaker 10/17/00, 07/31/07   [employer]tronic Adapta DR  ADDRL1   T2DM (type 2 diabetes mellitus) ([place])    Thrombocytopenia (HCC)    TIA (transient ischemic attack)    Wears dentures    full upper, partial lower    Past Surgical History: Past Surgical History:  Procedure Laterality Date   CATARACT EXTRACTION W/PHACO Left 06/30/2015   Procedure: CATARACT EXTRACTION PHACO AND INTRAOCULAR LENS PLACEMENT ([place]);  Surgeon: Leandrew Koyanagi, MD;  Location: [facility];  Service: Ophthalmology;  Laterality: Left;  DIABETIC - oral meds   CATARACT EXTRACTION W/PHACO Right 07/28/2015   Procedure: CATARACT EXTRACTION PHACO AND INTRAOCULAR LENS PLACEMENT ([facility])  right eye;  Surgeon: Lockie Mola, MD;  Location: [facility];  Service: Ophthalmology;  Laterality: Right;  DIABETIC - oral meds TORIC   COLONOSCOPY     CORONARY ANGIOPLASTY  11/12/2006   stent - RDA   CYSTOSCOPY N/A 08/31/2020   Procedure: CYSTOSCOPY/FLEXIBLE;  Surgeon: Riki Altes, MD;  Location: [facility];  Service: Urology;  Laterality: N/A;   ESOPHAGOGASTRODUODENOSCOPY N/A 12/08/2020   Procedure: ESOPHAGOGASTRODUODENOSCOPY (EGD);  Surgeon: Toledo, Boykin Nearing, MD;  Location: [facility]  ENDOSCOPY;  Service: Gastroenterology;  Laterality: N/A;   EYE SURGERY     HYDROCELE EXCISION Left 08/31/2020   Procedure: HYDROCELECTOMY ADULT VS. SPERMATOCELECTOMY;  Surgeon: Riki Altes, MD;  Location: [facility];  Service: Urology;  Laterality: Left;   INSERT / REPLACE / REMOVE PACEMAKER  10/17/00, 07/31/07   LEFT HEART CATH AND CORONARY ANGIOGRAPHY N/A 07/03/2016   Procedure: Left Heart Cath and Coronary Angiography;  Surgeon: Dalia Heading, MD;  Location: [facility];  Service: Cardiovascular;  Laterality: N/A;   NECK SURGERY  2015   SHOULDER SURGERY Bilateral 1990   x3   SPERMATOCELECTOMY N/A 08/31/2020   Procedure: SPERMATOCELECTOMY;  Surgeon: Riki Altes, MD;  Location: [facility];  Service: Urology;  Laterality: N/A;   TONSILLECTOMY      Allergies: Allergies as of 11/18/2021 - Review Complete 11/18/2021  Allergen Reaction Noted   Ace inhibitors  12/07/2020   Dapagliflozin Other (See Comments) 12/03/2019   Donepezil Other (See Comments) 06/30/2019   Simvastatin  06/24/2015    Medications: Outpatient Encounter Medications as of 11/18/2021  Medication Sig   acetaminophen (TYLENOL) 500 MG tablet Take 500 mg by mouth every 6 (six) hours as needed for moderate pain.   albuterol (VENTOLIN HFA) 108 (90 Base) MCG/ACT inhaler Inhale 1-2 puffs into the lungs every 6 (six) hours as needed for wheezing or shortness of breath.   allopurinol (ZYLOPRIM) 100 MG tablet Take 100 mg by mouth daily.   aspirin 81 MG tablet Take 81 mg by mouth daily.   Choline Fenofibrate (FENOFIBRIC ACID) 135 MG CPDR Take 135 mg by mouth daily.   folic acid (FOLVITE) 1 MG tablet Take 1 mg by mouth daily.   glimepiride (AMARYL) 4 MG tablet Take 4 mg by mouth 2 (two) times daily.   hydroxychloroquine (PLAQUENIL) 200 MG tablet Take 200 mg by mouth daily.   metFORMIN (GLUCOPHAGE) 1000 MG tablet Take 1,000 mg by mouth daily.   metoprolol succinate (TOPROL-XL) 25 MG 24 hr tablet Take 25 mg by mouth  daily.   Omega-3 Fatty Acids (OMEGA-3 FISH OIL) 1200 MG CAPS Take 1,200 mg by mouth 2 (two) times daily.   pantoprazole (PROTONIX) 40 MG tablet Take 40 mg by mouth 2 (two) times daily.   sacubitril-valsartan (ENTRESTO) 24-26 MG Take 1 tablet by mouth 2 (two) times daily.   Semaglutide,0.25 or 0.5MG /DOS, (OZEMPIC, 0.25 OR 0.5 MG/DOSE,) 2 MG/1.5ML SOPN Inject 0.25 mg into the skin every Sunday.   spironolactone (ALDACTONE) 25 MG tablet Take 12.5 mg by mouth daily.   traMADol (ULTRAM) 50 MG tablet Take 100 mg by mouth 2 (two) times daily.   No facility-administered encounter medications on file as of 11/18/2021.    Social History: Social History   Tobacco Use   Smoking status: Never   Smokeless tobacco: Never  Substance Use Topics   Alcohol use: No   Drug use: Never    Family Medical History: No family history on file.  Physical Examination: Vitals:  11/18/21 0836  BP: (!) 166/78    General: Patient is well developed, well nourished, calm, collected, and in no apparent distress. Attention to examination is appropriate.  Respiratory: Patient is breathing without any difficulty.   NEUROLOGICAL:     Awake, alert, oriented to person, place, and time.  Speech is clear and fluent. Fund of knowledge is appropriate.   Cranial Nerves: Pupils equal round and reactive to light.  Facial tone is symmetric.  Facial sensation is symmetric.  ROM of spine:  Limited ROM of cervical spine with minimal pain  Well healed cervical incision  No abnormal lesions on exposed skin.   No posterior cervical tenderness. No tenderness left scapular region.   Strength: Side Biceps Triceps Deltoid Interossei Grip Wrist Ext. Wrist Flex.  R 5 5 5 5 5 5 5   L 5 5 5 5 5 5 5    Side Iliopsoas Quads Hamstring PF DF EHL  R 5 5 5 5 5 5   L 5 5 5 5 5 5    Reflexes are 2+ and symmetric at the biceps, triceps, brachioradialis, patella and achilles.   Hoffman's is absent.  Clonus is not present.   Bilateral  upper and lower extremity sensation is intact to light touch.    No evidence of dysmetria noted.  Gait is normal.    Medical Decision Making  Imaging: CT of cervical spine dated 08/23/21:  FINDINGS: Alignment: Unchanged 2 mm anterolisthesis of C2 on C3. New 2 mm retrolisthesis of C3 on C4 and 2 mm anterolisthesis of C7 on T1. Mild right convex curvature of the cervical spine.   Skull base and vertebrae: Interval C4-C7 ACDF. Prominent streak artifact associated with the interbody spacers at C4-5 and C5-6. Suspected solid arthrodesis at all of the postoperative levels. Facet ankylosis at C4-5 and C5-6. No acute fracture or suspicious osseous lesion.   Soft tissues and spinal canal: No prevertebral fluid or swelling. No visible canal hematoma.   Disc levels:   C2-3: Mild disc bulging, uncovertebral spurring, and mild right and severe left facet arthrosis result in mild-to-moderate left neural foraminal stenosis, progressed from prior. No evidence of significant spinal stenosis.   C3-4: Progressive, severe disc space narrowing with degenerative endplate changes and vacuum disc. Retrolisthesis, uncovertebral spurring, and moderate facet arthrosis result in mild-to-moderate right and moderate to severe left neural foraminal stenosis, progressed from prior. No evidence of significant spinal stenosis.   C4-5: Interval ACDF.  No evidence of significant residual stenosis.   C5-6: Interval ACDF. Moderate residual osseous neural foraminal stenosis on the left. Focal left paracentral endplate spurring without evidence of significant spinal stenosis.   C6-7: Interval ACDF. Diffuse osteophytic ridging results in moderate to severe bilateral neural foraminal stenosis. No evidence of significant spinal stenosis.   C7-T1: Anterolisthesis, uncovertebral spurring, and severe facet arthrosis result in moderate right and mild left neural foraminal stenosis, increased from prior. No spinal  stenosis.   Upper chest: Clear lung apices.   Other: Calcific atherosclerosis at the left greater than right carotid bifurcations.   IMPRESSION: 1. Interval C4-C7 ACDF with suspected solid arthrodesis and no evidence of significant residual spinal stenosis. Moderate to severe residual bilateral neural foraminal stenosis at C6-7 and moderate left foraminal stenosis at C5-6. 2. Severe adjacent segment disease at C3-4 with mild-to-moderate right and moderate to severe left neural foraminal stenosis. 3. Increased, moderate right neural foraminal stenosis at C7-T1.     Electronically Signed   By: M.D.   On: 08/24/2021  13:05    I have personally reviewed the images and agree with the above interpretation.  Assessment and Plan: Mr. Graffeo is a pleasant 87 y.o. male with over a year of posterior neck pain that radiates up into the back of his head to the top of his head. Also with some pain in left shoulder blade. No arm pain. No numbness, tingling, or weakness in his arms.   History of ACDF C4-C7. Fusion looks good on CT. He has adjacent level DDD/facet hypertrophy at C3-C4 and C7-T1 along with bilateral foraminal stenosis. Has slip at C7-T1 as well.   Above treatment options discussed with patient and following plan made:   - Discussed possible cervical injections (bilateral C3-C4 facet injections?) and/or PT for cervical spine.  - He is interested in injection. Message sent to [facility] to have her staff contact him with appointment.  - He wants to hold on PT for now.  - BP is generally up at doctors office. He has no headaches, dizziness, SOB, or CP. Recheck at end of visit was better. He checks at home and it generally runs 130/90. He is to call PCP with any concerns.  - If no improvement with cervical injections, would consider referral to neurology for headaches. Currently, his primary complaint is neck pain.  - He will follow up with [place] prn.   I spent a total of 30  minutes in face-to-face and non-face-to-face activities related to this patient's care today.  Thank you for involving me in the care of this patient.   Geronimo Boot PA-C Dept. of Neurosurgery

## 2021-11-18 ENCOUNTER — Ambulatory Visit: Payer: Medicare HMO | Admitting: Orthopedic Surgery

## 2021-11-18 ENCOUNTER — Encounter: Payer: Self-pay | Admitting: Orthopedic Surgery

## 2021-11-18 VITALS — BP 136/74 | Ht 73.0 in | Wt 193.0 lb

## 2021-11-18 DIAGNOSIS — M542 Cervicalgia: Secondary | ICD-10-CM | POA: Diagnosis not present

## 2021-11-18 DIAGNOSIS — M47812 Spondylosis without myelopathy or radiculopathy, cervical region: Secondary | ICD-10-CM | POA: Diagnosis not present

## 2021-11-18 DIAGNOSIS — Z981 Arthrodesis status: Secondary | ICD-10-CM | POA: Diagnosis not present

## 2023-03-09 IMAGING — CT CT L SPINE W/O CM
4 of 7 series · 14 of 33 positions shown, 16 images · non-contrast
Comparison: None.

CLINICAL DATA: Chronic low back pain ongoing for 2 years.

EXAM:
CT LUMBAR SPINE WITHOUT CONTRAST
TECHNIQUE: Multidetector CT imaging of the lumbar spine was performed without
intravenous contrast administration. Multiplanar CT image
reconstructions were also generated.

[Series 2: sag bone l-spine 2.00 cor · coronal · 0.33mm/px · 1 of 84 slices shown]
[im 42/84  bone]
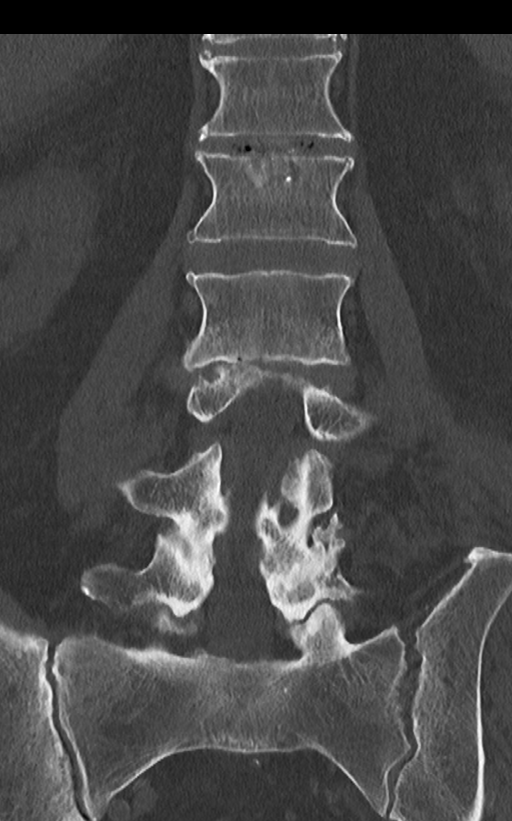

[Series 6: sag bone l-spine 2.00 sag · sagittal · 0.33mm/px · 3 of 84 slices shown]
[im 21/84  bone]
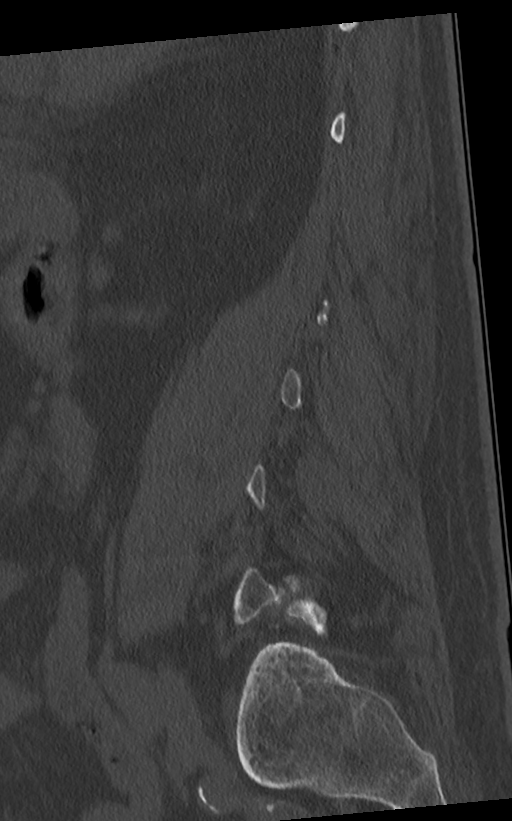
[im 42/84  bone]
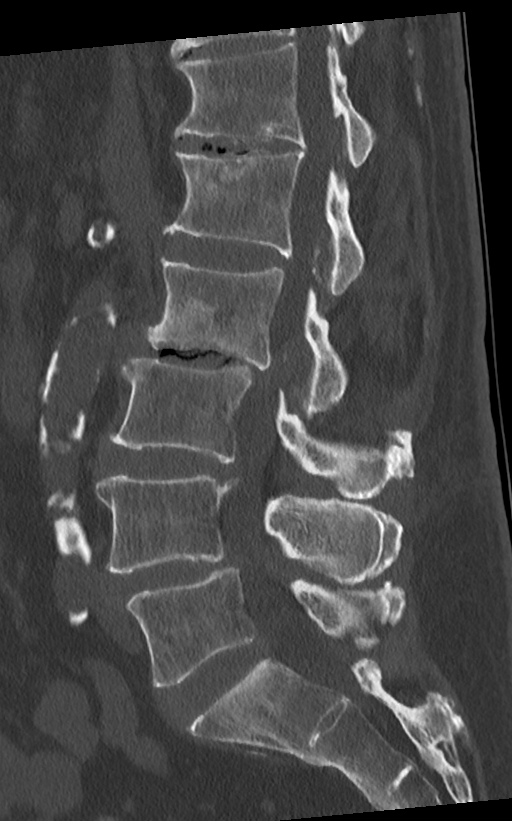
[im 63/84  bone]
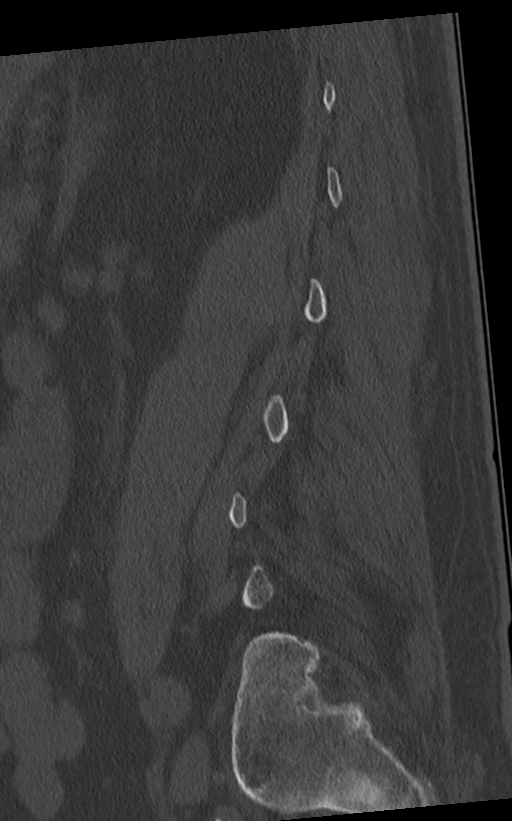

[Series 12: lspine st l-spine 2.00 · axial · 0.33mm/px · z∈[-1355,-1185]mm · 5 of 129 slices shown, 7 images]
[im 22/129  soft-tissue]
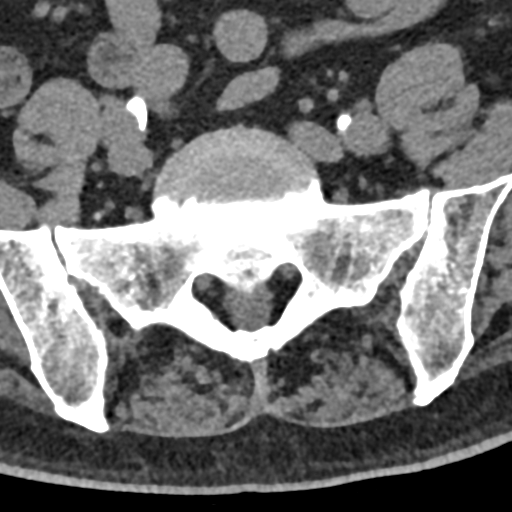
[im 22/129  bone]
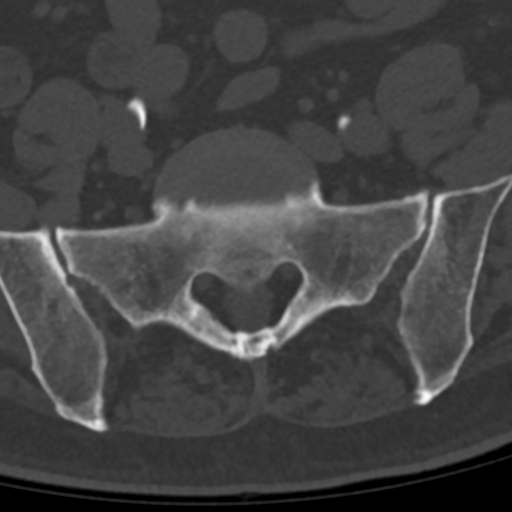
[im 43/129  bone]
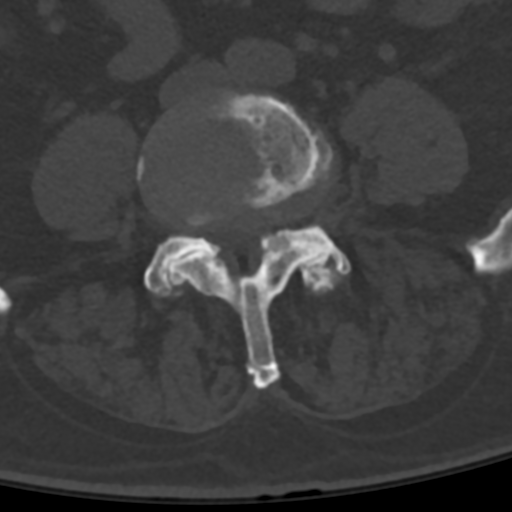
[im 65/129  bone]
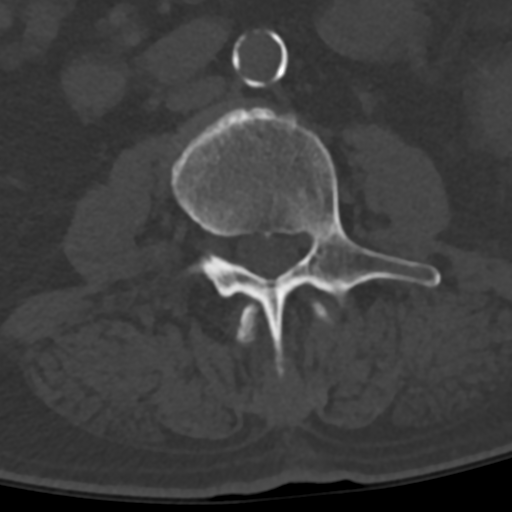
[im 86/129  bone]
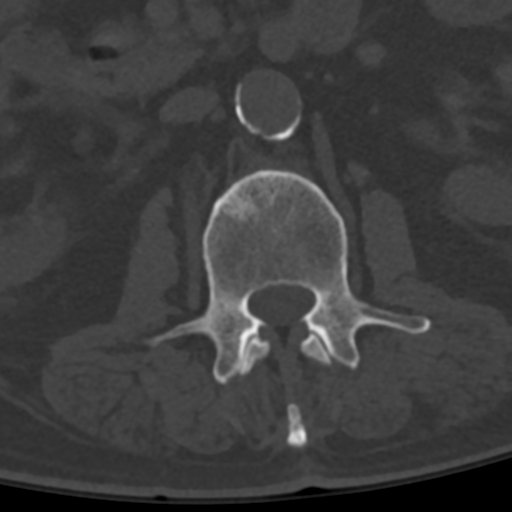
[im 107/129  soft-tissue]
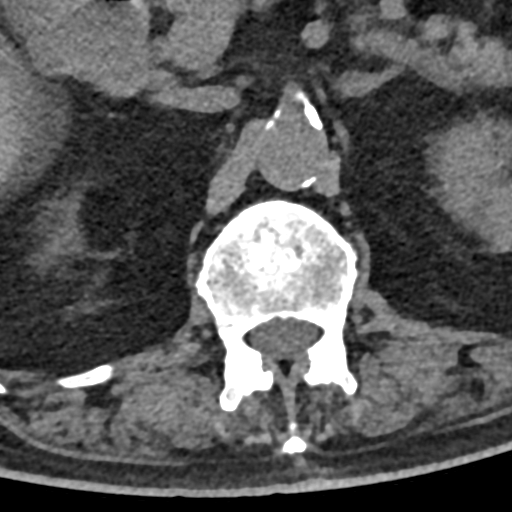
[im 107/129  bone]
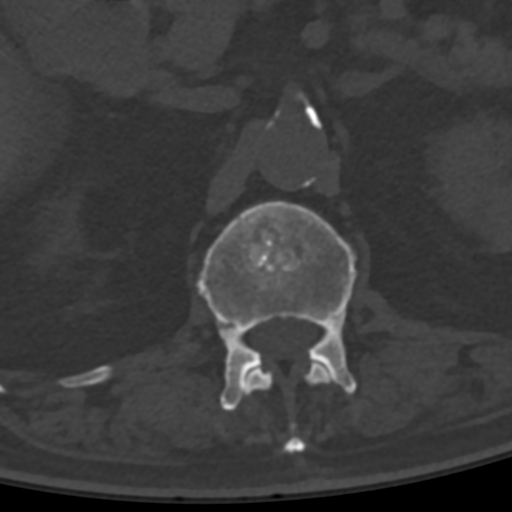

[Series 13: lspine bone l-spine 2.00 · axial · 0.33mm/px · z∈[-1355,-1185]mm · 5 of 129 slices shown]
[im 22/129  bone]
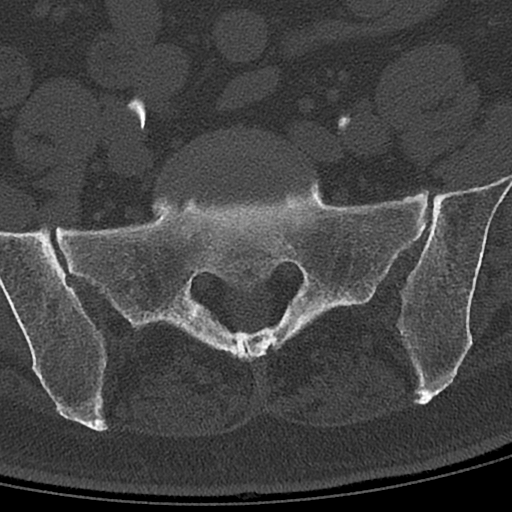
[im 43/129  bone]
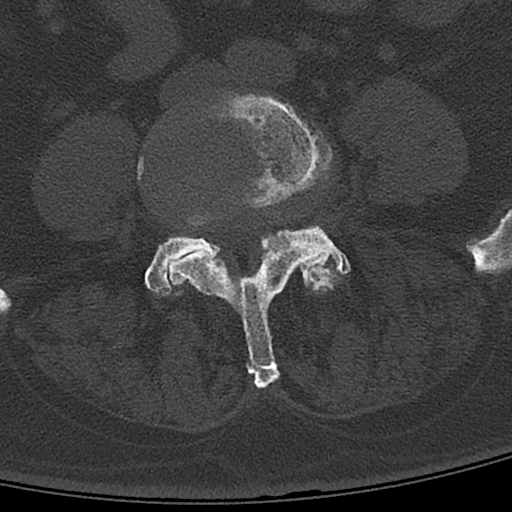
[im 65/129  bone]
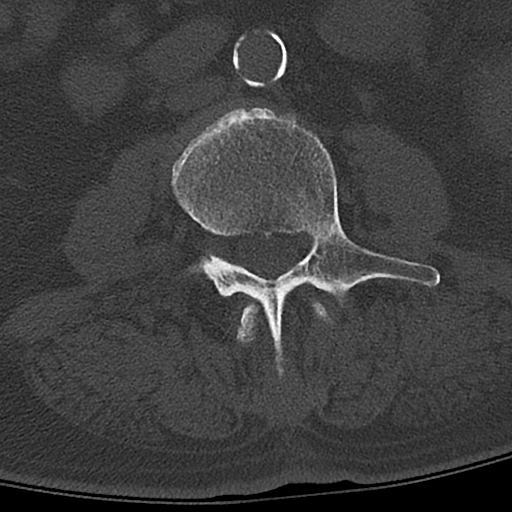
[im 86/129  bone]
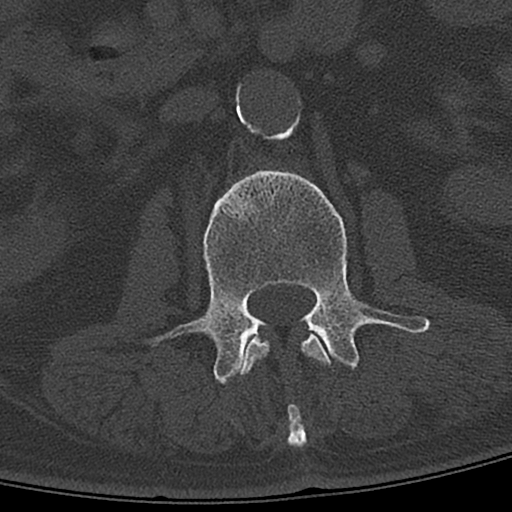
[im 107/129  bone]
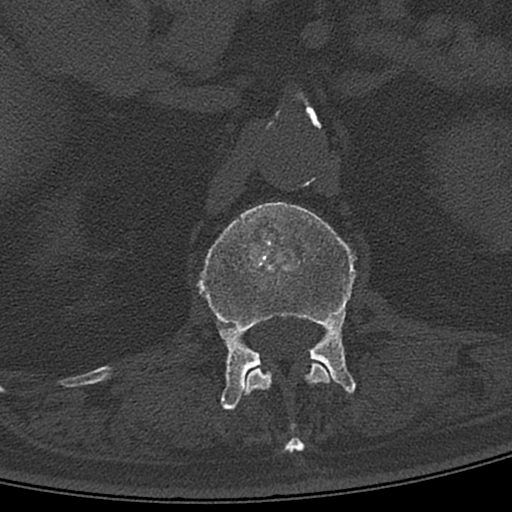

[14 of 33 positions shown; findings below may reference images not displayed]

FINDINGS: Segmentation: 5 lumbar type vertebrae.

Alignment: 2 mm retrolisthesis of L2 on L3.

Vertebrae: No acute fracture or focal pathologic process.

Paraspinal and other soft tissues: Negative. Abdominal aortic
atherosclerosis.

Other: Mild osteoarthritis of bilateral SI joints.

Disc levels:

Disc spaces: Degenerative disease with disc height loss at T12-L1,
L1-2, L2-3, L4-5.

T12-L1: No disc protrusion. No foraminal or central canal stenosis.
Mild bilateral facet arthropathy.

L1-L2: Broad-based disc bulge. Mild bilateral facet arthropathy.
Mild spinal stenosis. No foraminal stenosis.

L2-L3: Broad-based disc bulge. Moderate bilateral facet arthropathy.
Moderate-severe spinal stenosis. No foraminal stenosis.

L3-L4: Broad-based disc bulge. Moderate bilateral facet arthropathy.
Severe spinal stenosis. Mild left foraminal stenosis. No right
foraminal stenosis.

L4-L5: Broad-based disc bulge. Severe bilateral facet arthropathy.
Severe spinal stenosis. Mild bilateral foraminal narrowing.

L5-S1: Broad-based disc bulge. Moderate bilateral facet arthropathy.
No foraminal stenosis.
IMPRESSION: 1. Diffuse lumbar spine spondylosis as described above.
2. No acute osseous injury of the lumbar spine.
3. Aortic Atherosclerosis ([id number]).
